# Patient Record
Sex: Female | Born: 1965 | Race: White | Hispanic: No
Health system: Southern US, Community
[De-identification: ages and names within clinical notes are randomized; demographics above are authoritative.]

## PROBLEM LIST (undated history)

## (undated) DIAGNOSIS — E119 Type 2 diabetes mellitus without complications: Secondary | ICD-10-CM

## (undated) DIAGNOSIS — I10 Essential (primary) hypertension: Secondary | ICD-10-CM

## (undated) DIAGNOSIS — C801 Malignant (primary) neoplasm, unspecified: Secondary | ICD-10-CM

## (undated) HISTORY — PX: BREAST SURGERY: SHX581

## (undated) HISTORY — DX: Malignant (primary) neoplasm, unspecified: C80.1

## (undated) HISTORY — PX: CHOLECYSTECTOMY: SHX55

---

## 2009-12-01 ENCOUNTER — Ambulatory Visit: Payer: Self-pay | Admitting: Genetic Counselor

## 2016-01-04 DIAGNOSIS — C50111 Malignant neoplasm of central portion of right female breast: Secondary | ICD-10-CM | POA: Insufficient documentation

## 2016-01-04 DIAGNOSIS — Z6836 Body mass index (BMI) 36.0-36.9, adult: Secondary | ICD-10-CM | POA: Insufficient documentation

## 2016-01-04 DIAGNOSIS — D499 Neoplasm of unspecified behavior of unspecified site: Secondary | ICD-10-CM | POA: Insufficient documentation

## 2016-02-12 DIAGNOSIS — E785 Hyperlipidemia, unspecified: Secondary | ICD-10-CM | POA: Diagnosis not present

## 2016-02-12 DIAGNOSIS — E1165 Type 2 diabetes mellitus with hyperglycemia: Secondary | ICD-10-CM | POA: Diagnosis not present

## 2016-02-12 DIAGNOSIS — C50919 Malignant neoplasm of unspecified site of unspecified female breast: Secondary | ICD-10-CM | POA: Diagnosis not present

## 2016-02-12 DIAGNOSIS — R002 Palpitations: Secondary | ICD-10-CM | POA: Diagnosis not present

## 2016-02-13 DIAGNOSIS — E1165 Type 2 diabetes mellitus with hyperglycemia: Secondary | ICD-10-CM | POA: Diagnosis not present

## 2016-02-13 DIAGNOSIS — R002 Palpitations: Secondary | ICD-10-CM | POA: Diagnosis not present

## 2016-02-13 DIAGNOSIS — C50919 Malignant neoplasm of unspecified site of unspecified female breast: Secondary | ICD-10-CM | POA: Diagnosis not present

## 2016-02-13 DIAGNOSIS — E785 Hyperlipidemia, unspecified: Secondary | ICD-10-CM | POA: Diagnosis not present

## 2016-02-15 DIAGNOSIS — R079 Chest pain, unspecified: Secondary | ICD-10-CM | POA: Insufficient documentation

## 2016-02-15 DIAGNOSIS — R002 Palpitations: Secondary | ICD-10-CM | POA: Insufficient documentation

## 2016-03-15 DIAGNOSIS — Z79810 Long term (current) use of selective estrogen receptor modulators (SERMs): Secondary | ICD-10-CM | POA: Diagnosis not present

## 2016-03-15 DIAGNOSIS — C50511 Malignant neoplasm of lower-outer quadrant of right female breast: Secondary | ICD-10-CM | POA: Diagnosis not present

## 2016-03-15 DIAGNOSIS — Z170 Estrogen receptor positive status [ER+]: Secondary | ICD-10-CM | POA: Diagnosis not present

## 2017-03-23 DIAGNOSIS — C50511 Malignant neoplasm of lower-outer quadrant of right female breast: Secondary | ICD-10-CM | POA: Diagnosis not present

## 2017-03-23 DIAGNOSIS — Z79810 Long term (current) use of selective estrogen receptor modulators (SERMs): Secondary | ICD-10-CM | POA: Diagnosis not present

## 2017-03-23 DIAGNOSIS — Z9011 Acquired absence of right breast and nipple: Secondary | ICD-10-CM | POA: Diagnosis not present

## 2017-03-23 DIAGNOSIS — Z170 Estrogen receptor positive status [ER+]: Secondary | ICD-10-CM | POA: Diagnosis not present

## 2017-11-06 ENCOUNTER — Ambulatory Visit: Payer: Self-pay | Admitting: Podiatry

## 2017-11-10 ENCOUNTER — Ambulatory Visit: Payer: BLUE CROSS/BLUE SHIELD | Admitting: Sports Medicine

## 2017-11-10 ENCOUNTER — Other Ambulatory Visit: Payer: Self-pay | Admitting: Sports Medicine

## 2017-11-10 ENCOUNTER — Ambulatory Visit (INDEPENDENT_AMBULATORY_CARE_PROVIDER_SITE_OTHER): Payer: BLUE CROSS/BLUE SHIELD

## 2017-11-10 ENCOUNTER — Encounter: Payer: Self-pay | Admitting: Sports Medicine

## 2017-11-10 DIAGNOSIS — M2042 Other hammer toe(s) (acquired), left foot: Secondary | ICD-10-CM

## 2017-11-10 DIAGNOSIS — M21619 Bunion of unspecified foot: Secondary | ICD-10-CM

## 2017-11-10 DIAGNOSIS — M2041 Other hammer toe(s) (acquired), right foot: Secondary | ICD-10-CM | POA: Diagnosis not present

## 2017-11-10 DIAGNOSIS — E1142 Type 2 diabetes mellitus with diabetic polyneuropathy: Secondary | ICD-10-CM

## 2017-11-10 DIAGNOSIS — M79674 Pain in right toe(s): Secondary | ICD-10-CM | POA: Diagnosis not present

## 2017-11-10 DIAGNOSIS — L03031 Cellulitis of right toe: Secondary | ICD-10-CM

## 2017-11-10 MED ORDER — CLINDAMYCIN HCL 300 MG PO CAPS
300.0000 mg | ORAL_CAPSULE | Freq: Three times a day (TID) | ORAL | 0 refills | Status: DC
Start: 2017-11-10 — End: 2018-10-17

## 2017-11-10 NOTE — Patient Instructions (Signed)

## 2017-11-10 NOTE — Progress Notes (Signed)
   Subjective:    Patient ID: Lauren Yu, female    DOB: July 28, 1965, 52 y.o.   MRN: [id number]  HPI    Review of Systems  Musculoskeletal: Positive for arthralgias, gait problem, joint swelling and myalgias.  All other systems reviewed and are negative.      Objective:   Physical Exam        Assessment & Plan:

## 2017-11-10 NOTE — Progress Notes (Signed)
Subjective: Lauren Yu is a 52 y.o. diabetic female patient presents to office today complaining of a moderately painful incurvated, red, hot, swollen medial nail border of the first toe on the right foot. This has been present for 3 days. Patient has treated this by trimming soaking with Epson salt and using peroxide. Patient reports that she continued to work for 12 hours even though her toe was inflamed and infected.  Patient denies fever/chills/nausea/vomitting/any other related constitutional symptoms at this time.  Patient also admits that she does have numbness over bilateral second toe that she has had for years and admits to significant bunion and hammertoe deformity on the left and history of toe fractures on right.  Patient is type II diabetic with blood sugar recorded this morning at 141 and last A1c 7.6.  Patient states that she last saw her primary care doctor Dr. Rock Nephew 1 to 2 years ago.  Review of Systems  Musculoskeletal: Positive for joint pain and myalgias.       Right toe pain and gait problem  All other systems reviewed and are negative.    There are no active problems to display for this patient.   No current outpatient medications on file prior to visit.   No current facility-administered medications on file prior to visit.     Not on File  Objective:  There were no vitals filed for this visit.  General: Well developed, nourished, in no acute distress, alert and oriented x3   Dermatology: Skin is warm, dry and supple bilateral.  Right hallux nail appears to be severely incurvated with hyperkeratosis formation at the distal aspects of the medial nail border. (+) Erythema. (+) Edema. (+) serosanguous drainage present. The remaining nails appear unremarkable at this time. There are no open sores, lesions or other signs of infection present.  Mild diffuse callusing plantar aspect of both feet.  Vascular: Dorsalis Pedis artery and Posterior Tibial artery pedal  pulses are 1/4 bilateral with immedate capillary fill time. Pedal hair growth present.  Trace edema present bilateral lower extremities with hyperpigmentation and varicosities consistent with venous stasis. Neruologic: Grossly intact via light touch bilateral.  Protective sensation slightly diminished especially over the second toes bilateral.  Musculoskeletal: Tenderness to palpation of the right hallux medial nail fold.  Long-standing bunion and hammertoe with worse deformity on left.  Muscular strength within normal limits in all groups bilateral.   X-rays left and right foot normal osseous mineralization, no fracture, there is dislocation at the second metatarsal phalangeal joint on the left consistent with severe hammertoe deformity and bunion with impingement on left, there is inferior and posterior calcaneal heel spur left greater than right and mild hammertoe deformity on right, soft tissues within normal limits, no other acute findings.  Assesement and Plan: Problem List Items Addressed This Visit    None    Visit Diagnoses    Paronychia of great toe, right    -  Primary   medial margin   Relevant Medications   clindamycin (CLEOCIN) 300 MG capsule   Toe pain, right       Hammer toes of both feet       Relevant Orders   DG Foot Complete Right   DG Foot Complete Left   Bunion       Diabetic polyneuropathy associated with type 2 diabetes mellitus ([place])         -Patient seen and evaluated -X-rays reviewed -Discussed with with patient condition of neuropathy with bunion and  hammertoe and advised at this time wide shoes cushions padding and monitoring if symptoms worsen we will do a further work-up for neuropathy and do a further evaluation for bunion and hammertoes -Discussed paronychia treatment alternatives and plan of care; Explained temporary nail avulsion and post procedure course to patient. - After a written and verbal consent, injected 3 ml of a 50:50 mixture of 2% plain  lidocaine and 0.5% plain marcaine in a normal hallux block fashion. Next, a betadine prep was performed. Anesthesia was tested and found to be appropriate.  The offending right hallux medial nail border was then incised from the hyponychium to the epinychium. The offending nail border was removed and cleared from the field. The area was curretted for any remaining nail or spicules and the area was then flushed with alcohol and dressed with antibiotic cream and a dry sterile dressing. -Patient was instructed to leave the dressing intact for today and begin soaking in a weak solution of betadine or Epsom salt and water tomorrow. Patient was instructed to soak for 15 minutes each day and apply neosporin and a gauze or bandaid dressing each day. -Patient was instructed to monitor the toe for signs of infection and return to office if toe becomes red, hot or swollen. -Prescribed clindamycin to take as instructed for paronychia for 10 days -Advised ice, elevation, and tylenol or motrin if needed for pain.  -Patient is to return in 2 weeks for follow up care/nail check or sooner if problems arise.  Landis Martins, DPM

## 2017-11-24 ENCOUNTER — Ambulatory Visit (INDEPENDENT_AMBULATORY_CARE_PROVIDER_SITE_OTHER): Payer: BLUE CROSS/BLUE SHIELD | Admitting: Sports Medicine

## 2017-11-24 ENCOUNTER — Encounter: Payer: Self-pay | Admitting: Sports Medicine

## 2017-11-24 ENCOUNTER — Telehealth: Payer: Self-pay | Admitting: *Deleted

## 2017-11-24 DIAGNOSIS — R252 Cramp and spasm: Secondary | ICD-10-CM | POA: Diagnosis not present

## 2017-11-24 DIAGNOSIS — I739 Peripheral vascular disease, unspecified: Secondary | ICD-10-CM

## 2017-11-24 DIAGNOSIS — E1142 Type 2 diabetes mellitus with diabetic polyneuropathy: Secondary | ICD-10-CM

## 2017-11-24 DIAGNOSIS — Z9889 Other specified postprocedural states: Secondary | ICD-10-CM

## 2017-11-24 NOTE — Progress Notes (Signed)
Subjective: Lauren Yu is a 52 y.o. female patient returns to office today for follow up evaluation after having Right Hallux medial permanent nail avulsion performed on 11-10-17. Patient has been soaking using epsom salt and applying topical antibiotic covered with bandaid daily. Patient deniesfever/chills/nausea/vomitting/any other related constitutional symptoms at this time.  Patient admits to increase swelling and cramping pain to both feet and legs which is concerning.  Patient reports that her brother had similar cramping symptoms and had vascular studies shown diminished circulation and she is concerned and want to have the same test done especially since she has had issues before in the past with her veins and discoloration.  Patient denies any rest pain or pain with extensive standing and walking however reports cramping in legs that is sporadic.  Patient has only tried gentle massage for these cramping no other treatment.  Patient reports that cramping has been going on for several years and denies any other acute issues at this time.  There are no active problems to display for this patient.   Current Outpatient Medications on File Prior to Visit  Medication Sig Dispense Refill  . clindamycin (CLEOCIN) 300 MG capsule Take 1 capsule (300 mg total) by mouth 3 (three) times daily. 30 capsule 0   No current facility-administered medications on file prior to visit.     Not on File  Objective:  General: Well developed, nourished, in no acute distress, alert and oriented x3   Dermatology: Skin is warm, dry and supple bilateral.  Right hallux medial nail bed appears to be clean, dry, with mild granular tissue and surrounding eschar/scab. (-) Erythema. (-) Edema. (-) serosanguous drainage present. The remaining nails appear unremarkable at this time. There are no other lesions or other signs of infection  present.  Neurovascular status: Intact.  Trace lower extremity swelling with venous  hyperpigmentation; No pain with calf compression bilateral.  Musculoskeletal: No tenderness palpation to right great toenail.  Subjective cramping occasionally bilateral.  Muscular strength within normal limits bilateral.   Assesement and Plan: Problem List Items Addressed This Visit    None    Visit Diagnoses    S/P nail surgery    -  Primary   Diabetic polyneuropathy associated with type 2 diabetes mellitus (HCC)       PVD (peripheral vascular disease) ([place])       Cramp in lower leg          -Examined patient  -Cleansed  right hallux medial nail fold and gently scrubbed with peroxide and q-tip/curetted away eschar at site and applied antibiotic cream covered with bandaid.  -Discussed plan of care with patient. -Patient no longer needs to soak and may leave open to air at night since area is well-healed -Patient was instructed to monitor the toe for reoccurrence and signs of infection; Patient advised to return to office or go to ER if toe becomes red, hot or swollen. Ordered vascular studies of-venous duplex for assessment of reflux and ABIs for assessment of arterial disease for a complete vascular study for surveillance -Patient is to return to office after vascular studies or sooner if problems arise.  Landis Martins, DPM

## 2017-11-24 NOTE — Telephone Encounter (Signed)
Faxed orders to [facility]. 

## 2017-11-24 NOTE — Telephone Encounter (Signed)
-----   Message from Landis Martins, [place] sent at 11/24/2017 10:55 AM EDT ----- Regarding: Venous reflux testing and ABIs Cramping and hyperpigmentation to legs Eval for worsening PVD -Dr. Chauncey Cruel

## 2017-11-27 NOTE — Telephone Encounter (Signed)
[facility] faxed notice to [phone], stating they are not currently performing vascular testing, recommended [facility] and [place].

## 2017-11-28 NOTE — Telephone Encounter (Signed)
Left message for pt to call for circulation appts.

## 2017-11-28 NOTE — Telephone Encounter (Addendum)
[facility] scheduled ABI with TBI 12/05/2017 arrive 11:00am for 11:30am, and venous insufficiency 12/06/2017 arrive 1:00pm for 1:30pm testing. Faxed orders to [place].

## 2017-12-06 ENCOUNTER — Encounter: Payer: Self-pay | Admitting: Sports Medicine

## 2018-01-01 DIAGNOSIS — Z853 Personal history of malignant neoplasm of breast: Secondary | ICD-10-CM | POA: Insufficient documentation

## 2018-01-01 DIAGNOSIS — Z1239 Encounter for other screening for malignant neoplasm of breast: Secondary | ICD-10-CM | POA: Insufficient documentation

## 2018-01-19 ENCOUNTER — Ambulatory Visit: Payer: BLUE CROSS/BLUE SHIELD | Admitting: Sports Medicine

## 2018-02-07 ENCOUNTER — Telehealth: Payer: Self-pay | Admitting: Sports Medicine

## 2018-02-07 NOTE — Telephone Encounter (Signed)
Pt called and left voicemail today @ 1134 stating she has an appt tomorrow in [place] for shoes and was wanting to know the cost and what the insurance is going to pay. Please call pt and advise.

## 2018-02-08 ENCOUNTER — Ambulatory Visit: Payer: BLUE CROSS/BLUE SHIELD | Admitting: *Deleted

## 2018-02-08 DIAGNOSIS — M21619 Bunion of unspecified foot: Secondary | ICD-10-CM

## 2018-02-08 DIAGNOSIS — M2042 Other hammer toe(s) (acquired), left foot: Secondary | ICD-10-CM

## 2018-02-08 DIAGNOSIS — M2041 Other hammer toe(s) (acquired), right foot: Secondary | ICD-10-CM

## 2018-02-08 DIAGNOSIS — E1142 Type 2 diabetes mellitus with diabetic polyneuropathy: Secondary | ICD-10-CM

## 2018-03-08 ENCOUNTER — Other Ambulatory Visit: Payer: BLUE CROSS/BLUE SHIELD

## 2018-03-16 ENCOUNTER — Ambulatory Visit (INDEPENDENT_AMBULATORY_CARE_PROVIDER_SITE_OTHER): Payer: BLUE CROSS/BLUE SHIELD | Admitting: Sports Medicine

## 2018-03-16 ENCOUNTER — Encounter: Payer: Self-pay | Admitting: Sports Medicine

## 2018-03-16 DIAGNOSIS — M2042 Other hammer toe(s) (acquired), left foot: Secondary | ICD-10-CM | POA: Diagnosis not present

## 2018-03-16 DIAGNOSIS — I739 Peripheral vascular disease, unspecified: Secondary | ICD-10-CM

## 2018-03-16 DIAGNOSIS — E1142 Type 2 diabetes mellitus with diabetic polyneuropathy: Secondary | ICD-10-CM | POA: Diagnosis not present

## 2018-03-16 DIAGNOSIS — M2041 Other hammer toe(s) (acquired), right foot: Secondary | ICD-10-CM

## 2018-03-16 DIAGNOSIS — M21619 Bunion of unspecified foot: Secondary | ICD-10-CM

## 2018-03-16 NOTE — Progress Notes (Signed)
Patient discussed with medical assistant. Agree with note. Patient to follow up as scheduled for continued care or sooner if problems or issues arise. -Dr. Calynn Ferrero 

## 2018-03-16 NOTE — Progress Notes (Signed)
Patient ID: Lauren Yu, female   DOB: [birth year], 52 y.o.   MRN: [id number]   Patient presents for diabetic shoe pick up, shoes are tried on for good fit.  Patient received 1 pair [employer] Women - Breeze Athletic Knit - FitLite Collection GREY in 10.5 extra wide and 3 pairs custom molded diabetic inserts.  Verbal and written break in and wear instructions given.

## 2018-03-16 NOTE — Patient Instructions (Signed)

## 2018-03-30 DIAGNOSIS — Z170 Estrogen receptor positive status [ER+]: Secondary | ICD-10-CM | POA: Diagnosis not present

## 2018-03-30 DIAGNOSIS — C50511 Malignant neoplasm of lower-outer quadrant of right female breast: Secondary | ICD-10-CM | POA: Diagnosis not present

## 2018-03-30 DIAGNOSIS — Z9011 Acquired absence of right breast and nipple: Secondary | ICD-10-CM

## 2018-03-30 DIAGNOSIS — Z79810 Long term (current) use of selective estrogen receptor modulators (SERMs): Secondary | ICD-10-CM | POA: Diagnosis not present

## 2018-04-19 NOTE — Progress Notes (Signed)
Patient ID: Lauren Yu, female   DOB: 1966/03/05, 52 y.o.   MRN: [id number]   Patient presents at Dr Leeanne Rio request to be measured for diabetic shoes and inserts with [facility] Certified Pedorthist.  Patient will be called when shoes and inserts arrive to schedule a fitting.

## 2018-06-19 DIAGNOSIS — M25562 Pain in left knee: Secondary | ICD-10-CM | POA: Insufficient documentation

## 2018-07-17 DIAGNOSIS — M1712 Unilateral primary osteoarthritis, left knee: Secondary | ICD-10-CM | POA: Insufficient documentation

## 2018-10-17 ENCOUNTER — Ambulatory Visit: Payer: BLUE CROSS/BLUE SHIELD | Admitting: Sports Medicine

## 2018-10-17 ENCOUNTER — Encounter: Payer: Self-pay | Admitting: Sports Medicine

## 2018-10-17 ENCOUNTER — Other Ambulatory Visit: Payer: Self-pay

## 2018-10-17 VITALS — Temp 96.7°F | Resp 16

## 2018-10-17 DIAGNOSIS — M79675 Pain in left toe(s): Secondary | ICD-10-CM

## 2018-10-17 DIAGNOSIS — L600 Ingrowing nail: Secondary | ICD-10-CM | POA: Diagnosis not present

## 2018-10-17 DIAGNOSIS — E1142 Type 2 diabetes mellitus with diabetic polyneuropathy: Secondary | ICD-10-CM

## 2018-10-17 MED ORDER — NEOMYCIN-POLYMYXIN-HC 3.5-10000-1 OT SOLN: OTIC | 0 refills | Status: DC

## 2018-10-17 NOTE — Progress Notes (Signed)
Subjective: Lauren Yu is a 53 y.o. female patient presents to office today complaining of a moderately painful incurvated left hallux medial nail border for the last couple of months states that there is 4-10 throbbing pain at the medial aspect of the first toe.  Patient admits that there is some hard callus skin and that grows along the margin as well as it feels like her nail is sticking in has tried no treatment because the last time she tried to trim her other toenail on her right foot she cause infection so she did not bother her at this time.  Patient denies redness warmth swelling or drainage reports that her last blood sugar on yesterday was 156 her last A1c 5.8 and saw her primary care doctor Dr. Rock Nephew 1 month ago.  No other pedal complaints noted.  There are no active problems to display for this patient.   Current Outpatient Medications on File Prior to Visit  Medication Sig Dispense Refill  . amLODipine (NORVASC) 2.5 MG tablet     . azithromycin (ZITHROMAX) 250 MG tablet Take by mouth.    . benzonatate (TESSALON) 100 MG capsule     . glipiZIDE (GLUCOTROL) 10 MG tablet     . guaiFENesin-codeine 100-10 MG/5ML syrup     . hydrochlorothiazide (HYDRODIURIL) 25 MG tablet     . lisinopril (ZESTRIL) 40 MG tablet lisinopril 40 mg tablet    . metroNIDAZOLE (FLAGYL) 500 MG tablet     . sitaGLIPtin-metformin (JANUMET) 50-1000 MG tablet Janumet 50 mg-1,000 mg tablet    . venlafaxine XR (EFFEXOR-XR) 150 MG 24 hr capsule venlafaxine ER 150 mg capsule,extended release 24 hr     No current facility-administered medications on file prior to visit.     Allergies  Allergen Reactions  . Penicillins Other (See Comments)    Unknown Unknown     Objective:  There were no vitals filed for this visit.  General: Well developed, nourished, in no acute distress, alert and oriented x3   Dermatology: Skin is warm, dry and supple bilateral.L hallux nail appears to be moderately incurvated with  hyperkeratosis formation at the distal aspects of the medial nail border. (-) Erythema. (+) Edema focal to the nail fold. (-) serosanguous  drainage present. The remaining nails appear unremarkable at this time. There are no open sores, lesions or other signs of infection  present.  Vascular: Dorsalis Pedis artery and Posterior Tibial artery pedal pulses are 1/4 bilateral with immedate capillary fill time. Pedal hair growth present that is sparse with venous hyperpigmentation noted bilateral.  Trace edema noted bilateral.  Neruologic: Grossly intact via light touch bilateral.  Musculoskeletal: Tenderness to palpation of the L medial hallux nail fold. Muscular strength within normal limits in all groups bilateral.  Bunion deformity noted left greater than right.  Assesement and Plan: Problem List Items Addressed This Visit    None    Visit Diagnoses    Ingrown nail    -  Primary   Toe pain, left       Diabetic polyneuropathy associated with type 2 diabetes mellitus (HCC)       Relevant Medications   glipiZIDE (GLUCOTROL) 10 MG tablet   lisinopril (ZESTRIL) 40 MG tablet   sitaGLIPtin-metformin (JANUMET) 50-1000 MG tablet   venlafaxine XR (EFFEXOR-XR) 150 MG 24 hr capsule     -Discussed treatment alternatives and plan of care; Explained permanent/temporary nail avulsion and post procedure course to patient. Patient elects for L hallux PNA.  -  After a verbal and written consent, injected 3 ml of a 50:50 mixture of 2% plain lidocaine and 0.5% plain marcaine in a normal hallux block fashion. Next, a betadine prep was performed. Anesthesia was tested and found to be appropriate.  The offending L hallux medial  nail border was then incised from the hyponychium to the epinychium. The offending nail border was removed and cleared from the field. The area was curretted for any remaining nail or spicules. Phenol application performed and the area was then flushed with alcohol and dressed with  antibiotic cream and a dry sterile dressing. -Patient was instructed to leave the dressing intact for today and begin soaking in a weak solution of betadine or Epsom salt and water tomorrow. Patient was instructed to soak for 15-20 minutes each day and apply corticosporin and a gauze or bandaid dressing each day. -Patient was instructed to monitor the toe for signs of infection and return to office if toe becomes red, hot or swollen. -Advised ice, elevation, and tylenol or motrin if needed for pain.  -Patient is to return in 2 weeks for follow up care/nail check or sooner if problems arise.  Landis Martins, DPM

## 2018-10-17 NOTE — Patient Instructions (Signed)

## 2018-10-22 DIAGNOSIS — K921 Melena: Secondary | ICD-10-CM | POA: Insufficient documentation

## 2018-10-22 DIAGNOSIS — K219 Gastro-esophageal reflux disease without esophagitis: Secondary | ICD-10-CM | POA: Insufficient documentation

## 2018-11-01 ENCOUNTER — Ambulatory Visit: Payer: BLUE CROSS/BLUE SHIELD | Admitting: Sports Medicine

## 2018-11-15 ENCOUNTER — Ambulatory Visit (INDEPENDENT_AMBULATORY_CARE_PROVIDER_SITE_OTHER): Payer: BC Managed Care – PPO | Admitting: Sports Medicine

## 2018-11-15 ENCOUNTER — Encounter: Payer: Self-pay | Admitting: Sports Medicine

## 2018-11-15 ENCOUNTER — Other Ambulatory Visit: Payer: Self-pay

## 2018-11-15 VITALS — Temp 98.0°F | Resp 16

## 2018-11-15 DIAGNOSIS — M722 Plantar fascial fibromatosis: Secondary | ICD-10-CM | POA: Diagnosis not present

## 2018-11-15 DIAGNOSIS — E1142 Type 2 diabetes mellitus with diabetic polyneuropathy: Secondary | ICD-10-CM

## 2018-11-15 DIAGNOSIS — M21619 Bunion of unspecified foot: Secondary | ICD-10-CM | POA: Diagnosis not present

## 2018-11-15 DIAGNOSIS — Z9889 Other specified postprocedural states: Secondary | ICD-10-CM

## 2018-11-15 DIAGNOSIS — M79675 Pain in left toe(s): Secondary | ICD-10-CM

## 2018-11-15 NOTE — Progress Notes (Signed)
Subjective: Tahliyah Anagnos is a 53 y.o. female patient returns to office today for follow up evaluation after having Left Hallux medial permanent nail avulsion performed on (10/17/2018). Patient has been soaking using epsom salt and applying topical antibiotic drops covered with bandaid daily. Patient deniesfever/chills/nausea/vomitting/any other related constitutional symptoms at this time.  Patient admits that sometimes she gets a burning pain over the left bunion especially when she is in her work shoes.  Patient denies any other issues and reports that her blood sugars are under good control.  There are no active problems to display for this patient.   Current Outpatient Medications on File Prior to Visit  Medication Sig Dispense Refill  . amLODipine (NORVASC) 2.5 MG tablet     . azithromycin (ZITHROMAX) 250 MG tablet Take by mouth.    . benzonatate (TESSALON) 100 MG capsule     . glipiZIDE (GLUCOTROL) 10 MG tablet     . guaiFENesin-codeine 100-10 MG/5ML syrup     . hydrochlorothiazide (HYDRODIURIL) 25 MG tablet     . lisinopril (ZESTRIL) 40 MG tablet lisinopril 40 mg tablet    . metroNIDAZOLE (FLAGYL) 500 MG tablet     . neomycin-polymyxin-hydrocortisone (CORTISPORIN) OTIC solution Apply 2-3 drops to the ingrown toenail site twice daily. Cover with band-aid. 10 mL 0  . sitaGLIPtin-metformin (JANUMET) 50-1000 MG tablet Janumet 50 mg-1,000 mg tablet    . venlafaxine XR (EFFEXOR-XR) 150 MG 24 hr capsule venlafaxine ER 150 mg capsule,extended release 24 hr     No current facility-administered medications on file prior to visit.     Allergies  Allergen Reactions  . Penicillins Other (See Comments)    Unknown Unknown     Objective:  General: Well developed, nourished, in no acute distress, alert and oriented x3   Dermatology: Skin is warm, dry and supple bilateral.  Left hallux medial nail bed appears to be clean, dry, with scab. (-) Erythema. (-) Edema. (-) serosanguous drainage  present. The remaining nails appear unremarkable at this time. There are no other lesions or other signs of infection present.  There is a raised soft tissue nodule in the left arch likely consistent with fibroma that is nonpainful or nonpulsatile measures less than 1 cm.  Neurovascular status: Intact. No lower extremity swelling; No pain with calf compression bilateral.  Musculoskeletal: Decreased tenderness to palpation of the left hallux medial nail fold.  Significant bunion and hammertoe bilateral left greater than right.  Pes planus foot type.. Muscular strength within normal limits bilateral.   Assesement and Plan: Problem List Items Addressed This Visit    None    Visit Diagnoses    Status post nail surgery    -  Primary   Toe pain, left       Diabetic polyneuropathy associated with type 2 diabetes mellitus (HCC)       Bunion       Plantar fibromatosis         -Examined patient  -Left hallux medial nail fold is well-healed patient no longer needs to soak or dress the area -Educated patient on long term care after nail surgery. -Patient was instructed to monitor the toe for reoccurrence and signs of infection; Patient advised to return to office or go to ER if toe becomes red, hot or swollen. -Dispensed to patient bunion shield to wear especially when she is in close toed shoes on the left to prevent rubbing or irritation to the bunion -Dispensed offloading padding for patient to use at  left arch and advised topical pain creams or rubs or silicone patches that can aid with helping to decrease the size of the plantar fibroma advised patient that we will closely monitor and if increases in size we will be more aggressive today the area measure less than 1 cm. -Patient is to return as needed or sooner if problems arise.  Advised patient that she should come at least once a year for routine diabetic yearly foot checkups.  Landis Martins, DPM

## 2019-01-21 ENCOUNTER — Inpatient Hospital Stay (HOSPITAL_COMMUNITY): Payer: BC Managed Care – PPO

## 2019-01-21 ENCOUNTER — Other Ambulatory Visit: Payer: Self-pay

## 2019-01-21 ENCOUNTER — Inpatient Hospital Stay (HOSPITAL_COMMUNITY)
Admission: AD | Admit: 2019-01-21 | Discharge: 2019-01-26 | DRG: 177 | Disposition: A | Discharge status: Home / Self Care | Payer: BC Managed Care – PPO | Source: Other Acute Inpatient Hospital | Attending: Internal Medicine | Admitting: Internal Medicine

## 2019-01-21 ENCOUNTER — Encounter (HOSPITAL_COMMUNITY): Payer: Self-pay | Admitting: Internal Medicine

## 2019-01-21 DIAGNOSIS — Z833 Family history of diabetes mellitus: Secondary | ICD-10-CM | POA: Diagnosis not present

## 2019-01-21 DIAGNOSIS — E1165 Type 2 diabetes mellitus with hyperglycemia: Secondary | ICD-10-CM | POA: Diagnosis present

## 2019-01-21 DIAGNOSIS — U071 COVID-19: Secondary | ICD-10-CM | POA: Diagnosis present

## 2019-01-21 DIAGNOSIS — I10 Essential (primary) hypertension: Secondary | ICD-10-CM | POA: Diagnosis present

## 2019-01-21 DIAGNOSIS — E785 Hyperlipidemia, unspecified: Secondary | ICD-10-CM | POA: Diagnosis present

## 2019-01-21 DIAGNOSIS — J1282 Pneumonia due to coronavirus disease 2019: Secondary | ICD-10-CM | POA: Diagnosis present

## 2019-01-21 DIAGNOSIS — Z79899 Other long term (current) drug therapy: Secondary | ICD-10-CM | POA: Diagnosis not present

## 2019-01-21 DIAGNOSIS — Z923 Personal history of irradiation: Secondary | ICD-10-CM | POA: Diagnosis not present

## 2019-01-21 DIAGNOSIS — Z7984 Long term (current) use of oral hypoglycemic drugs: Secondary | ICD-10-CM

## 2019-01-21 DIAGNOSIS — Z853 Personal history of malignant neoplasm of breast: Secondary | ICD-10-CM | POA: Diagnosis not present

## 2019-01-21 DIAGNOSIS — R0602 Shortness of breath: Secondary | ICD-10-CM | POA: Diagnosis present

## 2019-01-21 DIAGNOSIS — Z9221 Personal history of antineoplastic chemotherapy: Secondary | ICD-10-CM

## 2019-01-21 DIAGNOSIS — Z79810 Long term (current) use of selective estrogen receptor modulators (SERMs): Secondary | ICD-10-CM

## 2019-01-21 DIAGNOSIS — E876 Hypokalemia: Secondary | ICD-10-CM | POA: Diagnosis present

## 2019-01-21 DIAGNOSIS — R7982 Elevated C-reactive protein (CRP): Secondary | ICD-10-CM | POA: Diagnosis present

## 2019-01-21 DIAGNOSIS — J9601 Acute respiratory failure with hypoxia: Secondary | ICD-10-CM | POA: Diagnosis present

## 2019-01-21 DIAGNOSIS — J1289 Other viral pneumonia: Secondary | ICD-10-CM | POA: Diagnosis present

## 2019-01-21 HISTORY — DX: Type 2 diabetes mellitus without complications: E11.9

## 2019-01-21 HISTORY — DX: Essential (primary) hypertension: I10

## 2019-01-21 LAB — COMPREHENSIVE METABOLIC PANEL
ALT: 19 U/L (ref 0–44)
AST: 20 U/L (ref 15–41)
Albumin: 2.6 g/dL — ABNORMAL LOW (ref 3.5–5.0)
Alkaline Phosphatase: 36 U/L — ABNORMAL LOW (ref 38–126)
Anion gap: 10 (ref 5–15)
BUN: 14 mg/dL (ref 6–20)
CO2: 30 mmol/L (ref 22–32)
Calcium: 8.2 mg/dL — ABNORMAL LOW (ref 8.9–10.3)
Chloride: 98 mmol/L (ref 98–111)
Creatinine, Ser: 0.86 mg/dL (ref 0.44–1.00)
GFR calc Af Amer: >60 mL/min (ref >60)
GFR calc non Af Amer: >60 mL/min (ref >60)
Glucose, Bld: 176 mg/dL — ABNORMAL HIGH (ref 70–99)
Potassium: 3.3 mmol/L — ABNORMAL LOW (ref 3.5–5.1)
Sodium: 138 mmol/L (ref 135–145)
Total Bilirubin: 0.3 mg/dL (ref 0.3–1.2)
Total Protein: 6 g/dL — ABNORMAL LOW (ref 6.5–8.1)

## 2019-01-21 LAB — CBC WITH DIFFERENTIAL/PLATELET
Abs Immature Granulocytes: 0.07 10*3/uL (ref 0.00–0.07)
Basophils Absolute: 0 10*3/uL (ref 0.0–0.1)
Basophils Relative: 0 %
Eosinophils Absolute: 0 10*3/uL (ref 0.0–0.5)
Eosinophils Relative: 0 %
HCT: 33.3 % — ABNORMAL LOW (ref 36.0–46.0)
Hemoglobin: 11.1 g/dL — ABNORMAL LOW (ref 12.0–15.0)
Immature Granulocytes: 1 %
Lymphocytes Relative: 30 %
Lymphs Abs: 1.8 10*3/uL (ref 0.7–4.0)
MCH: 30.2 pg (ref 26.0–34.0)
MCHC: 33.3 g/dL (ref 30.0–36.0)
MCV: 90.7 fL (ref 80.0–100.0)
Monocytes Absolute: 0.3 10*3/uL (ref 0.1–1.0)
Monocytes Relative: 5 %
Neutro Abs: 3.8 10*3/uL (ref 1.7–7.7)
Neutrophils Relative %: 64 %
Platelets: 108 10*3/uL — ABNORMAL LOW (ref 150–400)
RBC: 3.67 MIL/uL — ABNORMAL LOW (ref 3.87–5.11)
RDW: 13 % (ref 11.5–15.5)
WBC: 5.9 10*3/uL (ref 4.0–10.5)
nRBC: 0 % (ref 0.0–0.2)

## 2019-01-21 LAB — HIV ANTIBODY (ROUTINE TESTING W REFLEX): HIV Screen 4th Generation wRfx: NONREACTIVE

## 2019-01-21 LAB — MRSA PCR SCREENING: MRSA by PCR: NEGATIVE

## 2019-01-21 LAB — FERRITIN: Ferritin: 539 ng/mL — ABNORMAL HIGH (ref 11–307)

## 2019-01-21 LAB — GLUCOSE, CAPILLARY
Glucose-Capillary: 137 mg/dL — ABNORMAL HIGH (ref 70–99)
Glucose-Capillary: 276 mg/dL — ABNORMAL HIGH (ref 70–99)

## 2019-01-21 LAB — TYPE AND SCREEN
ABO/RH(D): B POS
Antibody Screen: NEGATIVE

## 2019-01-21 LAB — SARS CORONAVIRUS 2 BY RT PCR (HOSPITAL ORDER, PERFORMED IN ~~LOC~~ HOSPITAL LAB): SARS Coronavirus 2: POSITIVE — AB

## 2019-01-21 LAB — D-DIMER, QUANTITATIVE: D-Dimer, Quant: 0.58 ug/mL-FEU — ABNORMAL HIGH (ref 0.00–0.50)

## 2019-01-21 LAB — C-REACTIVE PROTEIN: CRP: 10.3 mg/dL — ABNORMAL HIGH (ref <1.0)

## 2019-01-21 LAB — HEMOGLOBIN A1C
Hgb A1c MFr Bld: 8.4 % — ABNORMAL HIGH (ref 4.8–5.6)
Mean Plasma Glucose: 194.38 mg/dL

## 2019-01-21 LAB — ABO/RH: ABO/RH(D): B POS

## 2019-01-21 LAB — PROCALCITONIN: Procalcitonin: <0.1 ng/mL

## 2019-01-21 MED ORDER — INSULIN ASPART 100 UNIT/ML ~~LOC~~ SOLN
0.0000 [IU] | Freq: Three times a day (TID) | SUBCUTANEOUS | Status: DC
  Administered 2019-01-21: 2 [IU] via SUBCUTANEOUS
  Administered 2019-01-21: 17:00:00 11 [IU] via SUBCUTANEOUS
  Administered 2019-01-21: 12:00:00 8 [IU] via SUBCUTANEOUS
  Administered 2019-01-22: 5 [IU] via SUBCUTANEOUS

## 2019-01-21 MED ORDER — VITAMIN C 500 MG PO TABS
500.0000 mg | ORAL_TABLET | Freq: Every day | ORAL | Status: DC
  Administered 2019-01-21 – 2019-01-26 (×6): 500 mg via ORAL
  Filled 2019-01-21 (×6): qty 1

## 2019-01-21 MED ORDER — VENLAFAXINE HCL ER 150 MG PO CP24
150.0000 mg | ORAL_CAPSULE | Freq: Every day | ORAL | Status: DC
  Administered 2019-01-21 – 2019-01-26 (×6): 150 mg via ORAL
  Filled 2019-01-21 (×8): qty 1

## 2019-01-21 MED ORDER — CHLORHEXIDINE GLUCONATE CLOTH 2 % EX PADS
6.0000 | MEDICATED_PAD | Freq: Every day | CUTANEOUS | Status: DC
  Administered 2019-01-21 – 2019-01-22 (×2): 6 via TOPICAL

## 2019-01-21 MED ORDER — SODIUM CHLORIDE 0.9 % IV SOLN
200.0000 mg | Freq: Once | INTRAVENOUS | Status: DC
  Administered 2019-01-21: 17:00:00 200 mg via INTRAVENOUS
  Filled 2019-01-21: qty 40

## 2019-01-21 MED ORDER — ACETAMINOPHEN 325 MG PO TABS
650.0000 mg | ORAL_TABLET | Freq: Four times a day (QID) | ORAL | Status: DC | PRN
  Administered 2019-01-21 – 2019-01-22 (×2): 650 mg via ORAL
  Filled 2019-01-21 (×2): qty 2

## 2019-01-21 MED ORDER — SODIUM CHLORIDE 0.9 % IV SOLN
100.0000 mg | INTRAVENOUS | Status: AC
  Administered 2019-01-22 – 2019-01-25 (×4): 100 mg via INTRAVENOUS
  Filled 2019-01-21 (×4): qty 20

## 2019-01-21 MED ORDER — ONDANSETRON HCL 4 MG/2ML IJ SOLN
4.0000 mg | Freq: Four times a day (QID) | INTRAMUSCULAR | Status: DC | PRN
  Administered 2019-01-21: 4 mg via INTRAVENOUS
  Filled 2019-01-21: qty 2

## 2019-01-21 MED ORDER — METHYLPREDNISOLONE SODIUM SUCC 40 MG IJ SOLR
40.0000 mg | Freq: Two times a day (BID) | INTRAMUSCULAR | Status: DC
  Administered 2019-01-21 – 2019-01-25 (×9): 40 mg via INTRAVENOUS
  Filled 2019-01-21 (×9): qty 1

## 2019-01-21 MED ORDER — INSULIN ASPART 100 UNIT/ML ~~LOC~~ SOLN: 0.0000 [IU] | Freq: Three times a day (TID) | SUBCUTANEOUS | Status: DC

## 2019-01-21 MED ORDER — ENOXAPARIN SODIUM 40 MG/0.4ML ~~LOC~~ SOLN
40.0000 mg | SUBCUTANEOUS | Status: DC
  Administered 2019-01-21 – 2019-01-26 (×6): 40 mg via SUBCUTANEOUS
  Filled 2019-01-21 (×6): qty 0.4

## 2019-01-21 MED ORDER — ALBUTEROL SULFATE HFA 108 (90 BASE) MCG/ACT IN AERS
2.0000 | INHALATION_SPRAY | Freq: Four times a day (QID) | RESPIRATORY_TRACT | Status: DC | PRN
  Filled 2019-01-21: qty 6.7

## 2019-01-21 MED ORDER — HYDROCHLOROTHIAZIDE 25 MG PO TABS
25.0000 mg | ORAL_TABLET | Freq: Every day | ORAL | Status: DC
  Administered 2019-01-21 – 2019-01-23 (×3): 25 mg via ORAL
  Filled 2019-01-21 (×4): qty 1

## 2019-01-21 MED ORDER — ONDANSETRON HCL 4 MG PO TABS: 4.0000 mg | ORAL_TABLET | Freq: Four times a day (QID) | ORAL | Status: DC | PRN

## 2019-01-21 MED ORDER — ZINC SULFATE 220 (50 ZN) MG PO CAPS
220.0000 mg | ORAL_CAPSULE | Freq: Every day | ORAL | Status: DC
  Administered 2019-01-21 – 2019-01-26 (×6): 220 mg via ORAL
  Filled 2019-01-21 (×6): qty 1

## 2019-01-21 MED ORDER — LISINOPRIL 20 MG PO TABS
40.0000 mg | ORAL_TABLET | Freq: Every day | ORAL | Status: DC
  Administered 2019-01-21 – 2019-01-26 (×6): 40 mg via ORAL
  Filled 2019-01-21 (×6): qty 2
  Filled 2019-01-21: qty 4

## 2019-01-21 MED ORDER — ORAL CARE MOUTH RINSE
15.0000 mL | Freq: Two times a day (BID) | OROMUCOSAL | Status: DC
  Administered 2019-01-21 – 2019-01-25 (×10): 15 mL via OROMUCOSAL

## 2019-01-21 MED ORDER — AMLODIPINE BESYLATE 5 MG PO TABS
2.5000 mg | ORAL_TABLET | Freq: Every day | ORAL | Status: DC
  Administered 2019-01-21 – 2019-01-26 (×6): 2.5 mg via ORAL
  Filled 2019-01-21 (×6): qty 1

## 2019-01-21 MED ORDER — ACETAMINOPHEN 325 MG PO TABS
650.0000 mg | ORAL_TABLET | Freq: Once | ORAL | Status: AC
  Administered 2019-01-21: 03:00:00 650 mg via ORAL
  Filled 2019-01-21: qty 2

## 2019-01-21 MED ORDER — SODIUM CHLORIDE 0.9 % IV SOLN
200.0000 mg | Freq: Once | INTRAVENOUS | Status: DC
  Filled 2019-01-21: qty 40

## 2019-01-21 MED ORDER — ACETAMINOPHEN 650 MG RE SUPP: 650.0000 mg | Freq: Four times a day (QID) | RECTAL | Status: DC | PRN

## 2019-01-21 MED ORDER — HYDRALAZINE HCL 20 MG/ML IJ SOLN
5.0000 mg | Freq: Four times a day (QID) | INTRAMUSCULAR | Status: DC | PRN
  Administered 2019-01-21: 5 mg via INTRAVENOUS
  Filled 2019-01-21 (×2): qty 1

## 2019-01-21 MED ORDER — VITAMIN C 500 MG/5ML PO SYRP: 500.0000 mg | ORAL_SOLUTION | Freq: Every day | ORAL | Status: DC

## 2019-01-21 NOTE — Progress Notes (Addendum)
Please see H&P from this morning.  53 year old female with hypertension, hyperlipidemia, diabetes admitted with COVID pneumonia with acute hypoxic respiratory failure requiring 2 L of nasal cannula now.  Still complaining of some headache.  No nausea.  She is supposed to be receiving Ramdesivir (contacted pharmacy to f/u) on admission and currently on Solu-Medrol 40 mg every 12.  Added MDI for as needed use and vitamin C/zinc.  She is aware that she might be transferred to [place] if and when bed available, and is agreeable.

## 2019-01-21 NOTE — Progress Notes (Signed)
Inpatient Diabetes Program Recommendations  AACE/ADA: New Consensus Statement on Inpatient Glycemic Control (2015)  Target Ranges:  Prepandial:   less than 140 mg/dL      Peak postprandial:   less than 180 mg/dL (1-2 hours)      Critically ill patients:  140 - 180 mg/dL   Lab Results  Component Value Date   GLUCAP 276 (H) 01/21/2019   HGBA1C 8.4 (H) 01/21/2019    Review of Glycemic Control  Diabetes history: DM2 Outpatient Diabetes medications: glipizide 10 mg QAM, Janumet 50/1000 mg bid Current orders for Inpatient glycemic control: Novolog 0-15 units tidwc  On Solumedrol 40 mg Q12H HgbA1C - 8.4% - sub-optimal glycemic control  Inpatient Diabetes Program Recommendations:     Add Levemir 7 units bid Add Novolog HS correction Add Novolog 4 units tidwc for meal coverage insulin if pt eats > 50% meal  Will continue to follow closely.  Thank you. Lorenda Peck, RD, LDN, CDE Inpatient Diabetes Coordinator [phone]

## 2019-01-21 NOTE — H&P (Signed)
History and Physical    [name] DPO:[id number] DOB: 22-Mar-1966 DOA: 01/21/2019  PCP: Suzan Garibaldi, FNP  Patient coming from: Patient was transferred from [facility].  Chief Complaint: Shortness of breath.  HPI: Lauren Yu is a 53 y.o. female with history of diabetes mellitus type 2, hypertension, hyperlipidemia has been experiencing productive cough shortness of breath headache generalized body ache over the last 1 week.  Had gone to her primary care physician about a week ago and at that time was empirically placed on antibiotics and had COVID-19 test done which came back as positive two days later.  Over the last 24 hours patient symptoms have been worsening with increasing shortness of breath even at rest patient decided to come to the ER at [facility] patient was found to be hypoxic requiring 4 L oxygen with chest x-ray showing bilateral infiltrates and COVID test 19 was positive.  CRP levels were 63 ferritin at 500 platelets and 115.  Patient was transferred to [facility] for further management.  I have reviewed patient's chart from [facility].  LFTs and creatinine were normal.  Hemoglobin was around 12.  EKG was showing normal sinus rhythm.  Patient had frontal headache on arrival and I ordered Tylenol following which headache is resolved.  Appears nonfocal.  Not in distress.  Since arrival patient's oxygen requirement has decreased from 4 L to 2 L.  ED Course: Patient was a direct admit.  Review of Systems: As per HPI, rest all negative.   Past Medical History:  Diagnosis Date  . Diabetes mellitus without complication (Lauren Yu)   . Hypertension     Past Surgical History:  Procedure Laterality Date  . BREAST SURGERY    . CHOLECYSTECTOMY       reports that she has never smoked. She has never used smokeless tobacco. She reports that she does not drink alcohol. No history on file for drug.  Allergies  Allergen  Reactions  . Penicillins Other (See Comments)    Unknown Unknown     Family History  Problem Relation Age of Onset  . Breast cancer Mother   . Diabetes Mellitus II Mother     Prior to Admission medications   Medication Sig Start Date End Date Taking? Authorizing Provider  amLODipine (NORVASC) 2.5 MG tablet  10/09/18   [provider]  azithromycin (ZITHROMAX) 250 MG tablet Take by mouth.    [provider]  benzonatate (TESSALON) 100 MG capsule  08/02/18   [provider]  glipiZIDE (GLUCOTROL) 10 MG tablet  10/09/18   [provider]  guaiFENesin-codeine 100-10 MG/5ML syrup  08/08/18   [provider]  hydrochlorothiazide (HYDRODIURIL) 25 MG tablet  10/09/18   [provider]  lisinopril (ZESTRIL) 40 MG tablet lisinopril 40 mg tablet    [provider]  metroNIDAZOLE (FLAGYL) 500 MG tablet  08/13/18   [provider]  neomycin-polymyxin-hydrocortisone (CORTISPORIN) OTIC solution Apply 2-3 drops to the ingrown toenail site twice daily. Cover with band-aid. 10/17/18   Landis Martins, DPM  sitaGLIPtin-metformin (JANUMET) 50-1000 MG tablet Janumet 50 mg-1,000 mg tablet 12/19/17   [provider]  venlafaxine XR (EFFEXOR-XR) 150 MG 24 hr capsule venlafaxine ER 150 mg capsule,extended release 24 hr    [provider]    Physical Exam: Constitutional: Moderately built and nourished.  Blood pressure is 160/80 pulse is 80/min temperature 99 F aspiration 18/min on 2 L. There were no vitals filed for  this visit. Eyes: Anicteric no pallor. ENMT: No discharge from the ears eyes nose or mouth. Neck: No mass felt.  No neck rigidity. Respiratory: No rhonchi or crepitations. Cardiovascular: S1-S2 heard. Abdomen: Soft nontender bowel sounds present. Musculoskeletal: No edema.  No joint effusion. Skin: No rash. Neurologic: Alert awake oriented to time place and person.  Moves all extremities. Psychiatric: Appears  normal per normal affect.   Labs on Admission: I have personally reviewed following labs and imaging studies  CBC: No results for input(s): WBC, NEUTROABS, HGB, HCT, MCV, PLT in the last 168 hours. Basic Metabolic Panel: No results for input(s): NA, K, CL, CO2, GLUCOSE, BUN, CREATININE, CALCIUM, MG, PHOS in the last 168 hours. GFR: CrCl cannot be calculated (No successful lab value found.). Liver Function Tests: No results for input(s): AST, ALT, ALKPHOS, BILITOT, PROT, ALBUMIN in the last 168 hours. No results for input(s): LIPASE, AMYLASE in the last 168 hours. No results for input(s): AMMONIA in the last 168 hours. Coagulation Profile: No results for input(s): INR, PROTIME in the last 168 hours. Cardiac Enzymes: No results for input(s): CKTOTAL, CKMB, CKMBINDEX, TROPONINI in the last 168 hours. BNP (last 3 results) No results for input(s): PROBNP in the last 8760 hours. HbA1C: No results for input(s): HGBA1C in the last 72 hours. CBG: No results for input(s): GLUCAP in the last 168 hours. Lipid Profile: No results for input(s): CHOL, HDL, LDLCALC, TRIG, CHOLHDL, LDLDIRECT in the last 72 hours. Thyroid Function Tests: No results for input(s): TSH, T4TOTAL, FREET4, T3FREE, THYROIDAB in the last 72 hours. Anemia Panel: No results for input(s): VITAMINB12, FOLATE, FERRITIN, TIBC, IRON, RETICCTPCT in the last 72 hours. Urine analysis: No results found for: COLORURINE, APPEARANCEUR, LABSPEC, PHURINE, GLUCOSEU, HGBUR, BILIRUBINUR, KETONESUR, PROTEINUR, UROBILINOGEN, NITRITE, LEUKOCYTESUR Sepsis Labs: @LABRCNTIP (procalcitonin:4,lacticidven:4) )No results found for this or any previous visit (from the past 240 hour(s)).   Radiological Exams on Admission: No results found.  EKG: Independently reviewed.  Normal sinus rhythm with EKG done at another hospital.  Assessment/Plan Principal Problem:   Acute respiratory failure with hypoxia (HCC) Active Problems:   Pneumonia due to  COVID-19 virus   Controlled type 2 diabetes mellitus with hyperglycemia ([place])   Essential hypertension    1. Acute respiratory failure with hypoxia secondary to COVID pneumonia -given the elevated CRP and ferritin levels with chest x-ray showing bilateral infiltrates normal LFTs we will keep patient on Solu-Medrol and will consult pharmacy for [place].  After admission patient's oxygen requirements have decreased and is presently on 2 L.  Repeat labs including metabolic panel CRP CBC has been ordered chest x-ray is pending. 2. Diabetes mellitus type 2 we will keep patient on moderate dose sliding scale coverage since patient is on Solu-Medrol.  May need further titrating given the Solu-Medrol. 3. Hypertension on lisinopril hydrochlorothiazide.  Patient's potassium was in the lower side around 3.4.  If repeat metabolic panel still shows hypokalemia to hold hydrochlorothiazide. 4. Mild hypokalemia repeat metabolic panel has been ordered see #3. 5. Hyperlipidemia on statins. 6. History of depression on Effexor.  Given the acute hypoxic respiratory failure with COVID-19 and multiple comorbidities patient will require inpatient status.   DVT prophylaxis: Lovenox. Code Status: Full code. Family Communication: Discussed with patient. Disposition Plan: Home. Consults called: None. Admission status: Inpatient.   Rise Patience MD [facility] Pager [phone].  If 7PM-7AM, please contact night-coverage [email or web address] Password [id number]  01/21/2019, 2:47 AM

## 2019-01-21 NOTE — Progress Notes (Addendum)
Brief Pharmacy Note: Lovenox for VTE prophylaxis, Remdesivir  TBW 95 kg, using Lovenox 40mg  SQ daily Ddimer 0.58, not elevated, no need to use increased dose Lovenox for Covid patient  Remdesivir 200mg  today 8/17, followed by 100mg  daily x 4 doses if continues to meet criteria - requiring O2 - signs PNA on Xray  Daily CMET for Remdesivir monitoring  Minda Ditto PharmD Pager [phone] 01/21/2019, 9:31 AM

## 2019-01-22 LAB — GLUCOSE, CAPILLARY
Glucose-Capillary: 332 mg/dL — ABNORMAL HIGH (ref 70–99)
Glucose-Capillary: 225 mg/dL — ABNORMAL HIGH (ref 70–99)
Glucose-Capillary: 378 mg/dL — ABNORMAL HIGH (ref 70–99)
Glucose-Capillary: 359 mg/dL — ABNORMAL HIGH (ref 70–99)
Glucose-Capillary: 411 mg/dL — ABNORMAL HIGH (ref 70–99)

## 2019-01-22 LAB — MAGNESIUM: Magnesium: 1.7 mg/dL (ref 1.7–2.4)

## 2019-01-22 LAB — TYPE AND SCREEN
ABO/RH(D): B POS
Antibody Screen: NEGATIVE

## 2019-01-22 LAB — ABO/RH: ABO/RH(D): B POS

## 2019-01-22 MED ORDER — INSULIN ASPART 100 UNIT/ML ~~LOC~~ SOLN
0.0000 [IU] | Freq: Every day | SUBCUTANEOUS | Status: DC
  Administered 2019-01-22 – 2019-01-24 (×3): 5 [IU] via SUBCUTANEOUS
  Administered 2019-01-25: 3 [IU] via SUBCUTANEOUS

## 2019-01-22 MED ORDER — INSULIN ASPART 100 UNIT/ML ~~LOC~~ SOLN
6.0000 [IU] | Freq: Three times a day (TID) | SUBCUTANEOUS | Status: DC
  Administered 2019-01-22 – 2019-01-23 (×5): 6 [IU] via SUBCUTANEOUS

## 2019-01-22 MED ORDER — POTASSIUM CHLORIDE CRYS ER 20 MEQ PO TBCR
40.0000 meq | EXTENDED_RELEASE_TABLET | Freq: Two times a day (BID) | ORAL | Status: AC
  Administered 2019-01-22 (×2): 40 meq via ORAL
  Filled 2019-01-22 (×2): qty 2

## 2019-01-22 MED ORDER — INSULIN DETEMIR 100 UNIT/ML ~~LOC~~ SOLN
20.0000 [IU] | Freq: Every day | SUBCUTANEOUS | Status: DC
  Administered 2019-01-22 – 2019-01-25 (×4): 20 [IU] via SUBCUTANEOUS
  Filled 2019-01-22 (×5): qty 0.2

## 2019-01-22 MED ORDER — INSULIN ASPART 100 UNIT/ML ~~LOC~~ SOLN
0.0000 [IU] | Freq: Three times a day (TID) | SUBCUTANEOUS | Status: DC
  Administered 2019-01-22 (×2): 20 [IU] via SUBCUTANEOUS
  Administered 2019-01-23: 3 [IU] via SUBCUTANEOUS
  Administered 2019-01-23: 18:00:00 20 [IU] via SUBCUTANEOUS
  Administered 2019-01-23: 14:00:00 15 [IU] via SUBCUTANEOUS
  Administered 2019-01-24: 4 [IU] via SUBCUTANEOUS
  Administered 2019-01-24 – 2019-01-25 (×2): 15 [IU] via SUBCUTANEOUS
  Administered 2019-01-25: 3 [IU] via SUBCUTANEOUS
  Administered 2019-01-25: 11 [IU] via SUBCUTANEOUS
  Administered 2019-01-26: 13:00:00 7 [IU] via SUBCUTANEOUS

## 2019-01-22 NOTE — Progress Notes (Signed)
[facility] PROGRESS NOTE    Progress Note  Lauren Yu  QPY:[id number] DOB: 1966/01/22 DOA: 01/21/2019 PCP: Suzan Garibaldi, FNP     Brief Narrative:   Lauren Yu is an 53 y.o. female past medical history of diabetes mellitus, hypertension, history of right breast cancer T3 N1 M0 stage IIIa HER-2 negative she underwent mastectomy with lymph node biopsy on  and adjuvant radiation  And neoadjuvant chemotherapy therapy currently on tamoxifen has been experiencing productive cough and body aches for a week went to her primary care doctor placed empirically on antibiotic SARS-CoV-2 PCR was determined who came back positive on 01/20/2019.  She relates that over the last 24 hours her symptoms of gotten progressively worse especially her hypoxia she went to [place] where she was found hypoxic was placed on 4 L of oxygen chest x-ray showed bilateral infiltrates CRP of 63 ferritin of 500 platelet count of 15 LFTs and creatinine are stable.  Assessment/Plan:   Acute respiratory failure with hypoxia due to pneumonia due to COVID-19: She is requiring 3 L of oxygen keep saturations above 94%. She is started empirically on IV Remdesivir and steroids. She relates that over the last 24 hours her respiratory status has improved. I did talk to her about Actemra and she would like to think about it.  Diabetes mellitus type 2: She was started on IV Solu-Medrol, A1c is pending. Hold all oral hypoglycemic agents.  Also long-acting insulin plus sliding scale.  Essential hypertension: Continue current regimen blood pressure is well controlled. Norvasc, HCTZ and lisinopril.  Hypokalemia: Replete orally check a magnesium level and be met tomorrow morning.  DVT prophylaxis: lovenox Family Communication:none Disposition Plan/Barrier to D/C: home once off oxygen Code Status:     Code Status Orders  (From admission, onward)         Start     Ordered   01/21/19 0234  Full code   Continuous     01/21/19 0239        Code Status History    This patient has a current code status but no historical code status.   Advance Care Planning Activity        IV Access:    Peripheral IV   Procedures and diagnostic studies:   Dg Chest Port 1 View  Result Date: 01/21/2019 CLINICAL DATA:  Worsening COVID-19 symptoms EXAM: PORTABLE CHEST 1 VIEW COMPARISON:  Yesterday FINDINGS: Patchy bilateral pulmonary opacity. Cardiomegaly with vascular pedicle widening. No edema, effusion or pneumothorax. IMPRESSION: Unchanged bilateral pneumonia. Electronically Signed   By: Monte Fantasia M.D.   On: 01/21/2019 06:49     Medical Consultants:    None.  Anti-Infectives:   IV Remdesivir  Subjective:    Lauren Yu she relates she feels about the same as yesterday.  Objective:    Vitals:   01/22/19 0031 01/22/19 0305 01/22/19 0422 01/22/19 0824  BP: (!) 152/88 (!) 141/76 138/88 124/75  Pulse:  88 66 81  Resp: (!) 21 19 (!) 21 (!) 21  Temp: 98.9 F (37.2 C) 98.2 F (36.8 C) 98 F (36.7 C) 98.3 F (36.8 C)  TempSrc: Oral Oral Oral Oral  SpO2: 96% 94% 97% 94%  Weight: 95.2 kg     Height: '5\' 6"'  (1.676 m)      SpO2: 94 % O2 Flow Rate (L/min): 3 L/min   Intake/Output Summary (Last 24 hours) at 01/22/2019 1014 Last data filed at 01/21/2019 2000 Gross per 24 hour  Intake 0 ml  Output 3 ml  Net -3 ml   Filed Weights   01/21/19 0800 01/22/19 0031  Weight: 95.3 kg 95.2 kg    Exam: General exam: In no acute distress. Respiratory system: Good air movement and diffuse crackles bilaterally. Cardiovascular system: S1 & S2 heard, RRR. No JVD. Gastrointestinal system: Abdomen is nondistended, soft and nontender.  Central nervous system: Alert and oriented. No focal neurological deficits. Extremities: No pedal edema. Skin: No rashes, lesions or ulcers Psychiatry: Judgement and insight appear normal. Mood & affect appropriate.    Data Reviewed:    Labs:  Basic Metabolic Panel: Recent Labs  Lab 01/21/19 0633  NA 138  K 3.3*  CL 98  CO2 30  GLUCOSE 176*  BUN 14  CREATININE 0.86  CALCIUM 8.2*   GFR Estimated Creatinine Clearance: 87 mL/min (by C-G formula based on SCr of 0.86 mg/dL). Liver Function Tests: Recent Labs  Lab 01/21/19 0633  AST 20  ALT 19  ALKPHOS 36*  BILITOT 0.3  PROT 6.0*  ALBUMIN 2.6*   No results for input(s): LIPASE, AMYLASE in the last 168 hours. No results for input(s): AMMONIA in the last 168 hours. Coagulation profile No results for input(s): INR, PROTIME in the last 168 hours. COVID-19 Labs  Recent Labs    01/21/19 0633 01/21/19 1328  DDIMER  --  0.58*  FERRITIN 539*  --   CRP 10.3*  --     Lab Results  Component Value Date   SARSCOV2NAA POSITIVE (A) 01/21/2019    CBC: Recent Labs  Lab 01/21/19 0633  WBC 5.9  NEUTROABS 3.8  HGB 11.1*  HCT 33.3*  MCV 90.7  PLT 108*   Cardiac Enzymes: No results for input(s): CKTOTAL, CKMB, CKMBINDEX, TROPONINI in the last 168 hours. BNP (last 3 results) No results for input(s): PROBNP in the last 8760 hours. CBG: Recent Labs  Lab 01/21/19 0831 01/21/19 1134 01/21/19 1636 01/22/19 0824  GLUCAP 137* 276* 332* 225*   D-Dimer: Recent Labs    01/21/19 1328  DDIMER 0.58*   Hgb A1c: Recent Labs    01/21/19 0633  HGBA1C 8.4*   Lipid Profile: No results for input(s): CHOL, HDL, LDLCALC, TRIG, CHOLHDL, LDLDIRECT in the last 72 hours. Thyroid function studies: No results for input(s): TSH, T4TOTAL, T3FREE, THYROIDAB in the last 72 hours.  Invalid input(s): FREET3 Anemia work up: Recent Labs    01/21/19 [place]*   Sepsis Labs: Recent Labs  Lab 01/21/19 0633  PROCALCITON <0.10  WBC 5.9   Microbiology Recent Results (from the past 240 hour(s))  MRSA PCR Screening     Status: None   Collection Time: 01/21/19  1:55 AM   Specimen: Nasal Mucosa; Nasopharyngeal  Result Value Ref Range Status   MRSA by PCR NEGATIVE  NEGATIVE Final    Comment:        The GeneXpert MRSA Assay (FDA approved for NASAL specimens only), is one component of a comprehensive MRSA colonization surveillance program. It is not intended to diagnose MRSA infection nor to guide or monitor treatment for MRSA infections. Performed at [facility], [place] [address], [place], [place] [address]   SARS Coronavirus 2 [facility] order, Performed in [facility] hospital lab) Nasopharyngeal Nasopharyngeal Swab     Status: Abnormal   Collection Time: 01/21/19  2:46 AM   Specimen: Nasopharyngeal Swab  Result Value Ref Range Status   SARS Coronavirus 2 POSITIVE (A) NEGATIVE Final    Comment: CRITICAL RESULT CALLED TO, READ  BACK BY AND VERIFIED WITH: Scanlon [id number] @ [address] (NOTE) If result is NEGATIVE SARS-CoV-2 target nucleic acids are NOT DETECTED. The SARS-CoV-2 RNA is generally detectable in upper and lower  respiratory specimens during the acute phase of infection. The lowest  concentration of SARS-CoV-2 viral copies this assay can detect is 250  copies / mL. A negative result does not preclude SARS-CoV-2 infection  and should not be used as the sole basis for treatment or other  patient management decisions.  A negative result may occur with  improper specimen collection / handling, submission of specimen other  than nasopharyngeal swab, presence of viral mutation(s) within the  areas targeted by this assay, and inadequate number of viral copies  (<250 copies / mL). A negative result must be combined with clinical  observations, patient history, and epidemiological information. If result is POSITIVE SARS-CoV-2 target nucleic acids are  DETECTED. The SARS-CoV-2 RNA is generally detectable in upper and lower  respiratory specimens during the acute phase of infection.  Positive  results are indicative of active infection with SARS-CoV-2.  Clinical  correlation with patient history and other  diagnostic information is  necessary to determine patient infection status.  Positive results do  not rule out bacterial infection or co-infection with other viruses. If result is PRESUMPTIVE POSTIVE SARS-CoV-2 nucleic acids MAY BE PRESENT.   A presumptive positive result was obtained on the submitted specimen  and confirmed on repeat testing.  While 2019 novel coronavirus  (SARS-CoV-2) nucleic acids may be present in the submitted sample  additional confirmatory testing may be necessary for epidemiological  and / or clinical management purposes  to differentiate between  SARS-CoV-2 and other Sarbecovirus currently known to infect humans.  If clinically indicated additional testing with an alternate test  methodology  [id number]) is advised. The SARS-CoV-2 RNA is generally  detectable in upper and lower respiratory specimens during the acute  phase of infection. The expected result is Negative. Fact Sheet for Patients:  StrictlyIdeas.no Fact Sheet for Healthcare Providers: BankingDealers.co.za This test is not yet approved or cleared by the [place] FDA and has been authorized for detection and/or diagnosis of SARS-CoV-2 by FDA under an Emergency Use Authorization (EUA).  This EUA will remain in effect (meaning this test can be used) for the duration of the COVID-19 declaration under Section 564(b)(1) of the Act, 21 U.S.C. section 360bbb-3(b)(1), unless the authorization is terminated or revoked sooner. Performed at [facility], [place] [address], [place], [place] [address]      Medications:   . amLODipine  2.5 mg Oral Daily  . Chlorhexidine Gluconate Cloth  6 each Topical Daily  . enoxaparin (LOVENOX) injection  40 mg Subcutaneous Q24H  . hydrochlorothiazide  25 mg Oral Daily  . insulin aspart  0-15 Units Subcutaneous TID WC  . lisinopril  40 mg Oral Daily  . mouth rinse  15 mL Mouth Rinse BID  .  methylPREDNISolone (SOLU-MEDROL) injection  40 mg Intravenous Q12H  . venlafaxine XR  150 mg Oral Q breakfast  . vitamin C  500 mg Oral Daily  . zinc sulfate  220 mg Oral Daily   Continuous Infusions: . remdesivir 100 mg in NS 250 mL       LOS: 1 day   Charlynne Cousins  [facility]  01/22/2019, 10:14 AM

## 2019-01-23 LAB — CBC
HCT: 34.6 % — ABNORMAL LOW (ref 36.0–46.0)
Hemoglobin: 11.5 g/dL — ABNORMAL LOW (ref 12.0–15.0)
MCH: 30.1 pg (ref 26.0–34.0)
MCHC: 33.2 g/dL (ref 30.0–36.0)
MCV: 90.6 fL (ref 80.0–100.0)
Platelets: 148 10*3/uL — ABNORMAL LOW (ref 150–400)
RBC: 3.82 MIL/uL — ABNORMAL LOW (ref 3.87–5.11)
RDW: 12.5 % (ref 11.5–15.5)
WBC: 6.6 10*3/uL (ref 4.0–10.5)
nRBC: 0 % (ref 0.0–0.2)

## 2019-01-23 LAB — COMPREHENSIVE METABOLIC PANEL
ALT: 17 U/L (ref 0–44)
AST: 19 U/L (ref 15–41)
Albumin: 2.7 g/dL — ABNORMAL LOW (ref 3.5–5.0)
Alkaline Phosphatase: 41 U/L (ref 38–126)
Anion gap: 11 (ref 5–15)
BUN: 20 mg/dL (ref 6–20)
CO2: 31 mmol/L (ref 22–32)
Calcium: 9.2 mg/dL (ref 8.9–10.3)
Chloride: 96 mmol/L — ABNORMAL LOW (ref 98–111)
Creatinine, Ser: 0.7 mg/dL (ref 0.44–1.00)
GFR calc Af Amer: >60 mL/min (ref >60)
GFR calc non Af Amer: >60 mL/min (ref >60)
Glucose, Bld: 198 mg/dL — ABNORMAL HIGH (ref 70–99)
Potassium: 3.7 mmol/L (ref 3.5–5.1)
Sodium: 138 mmol/L (ref 135–145)
Total Bilirubin: 0.4 mg/dL (ref 0.3–1.2)
Total Protein: 6.7 g/dL (ref 6.5–8.1)

## 2019-01-23 LAB — GLUCOSE, CAPILLARY
Glucose-Capillary: 138 mg/dL — ABNORMAL HIGH (ref 70–99)
Glucose-Capillary: 314 mg/dL — ABNORMAL HIGH (ref 70–99)
Glucose-Capillary: 371 mg/dL — ABNORMAL HIGH (ref 70–99)
Glucose-Capillary: 422 mg/dL — ABNORMAL HIGH (ref 70–99)

## 2019-01-23 LAB — C-REACTIVE PROTEIN: CRP: 10.3 mg/dL — ABNORMAL HIGH (ref <1.0)

## 2019-01-23 LAB — FERRITIN: Ferritin: 801 ng/mL — ABNORMAL HIGH (ref 11–307)

## 2019-01-23 LAB — D-DIMER, QUANTITATIVE: D-Dimer, Quant: 2.8 ug/mL-FEU — ABNORMAL HIGH (ref 0.00–0.50)

## 2019-01-23 MED ORDER — POTASSIUM CHLORIDE CRYS ER 20 MEQ PO TBCR
20.0000 meq | EXTENDED_RELEASE_TABLET | Freq: Every day | ORAL | Status: DC
  Administered 2019-01-23: 20 meq via ORAL
  Filled 2019-01-23: qty 1

## 2019-01-23 MED ORDER — FUROSEMIDE 20 MG PO TABS
40.0000 mg | ORAL_TABLET | Freq: Every day | ORAL | Status: DC
  Administered 2019-01-23 – 2019-01-24 (×2): 40 mg via ORAL
  Filled 2019-01-23 (×2): qty 2

## 2019-01-23 NOTE — Progress Notes (Signed)
Family updated, agreeable with plan of care. 

## 2019-01-23 NOTE — Progress Notes (Signed)
PROGRESS NOTE                                                                                                                                                                                                             Patient Demographics:    Lauren Yu, is a 53 y.o. female, DOB - [birth year], LNL:[id number]  Outpatient Primary MD for the patient is Suzan Garibaldi, FNP   Admit date - 01/21/2019   LOS - 2  No chief complaint on file.      Brief Narrative: Patient is a 53 y.o. female with PMHx of HTN, DM, breast cancer s/p mastectomy, adjuvant radiation/chemotherapy-currently on tamoxifen-presenting with acute hypoxemic respiratory failure in the setting of COVID-19 pneumonia.   Subjective:    [name] today slightly better than yesterday-remains stable on 3 L of oxygen.   Assessment  & Plan :   Acute Hypoxic Resp Failure due to Covid 19 Viral pneumonia: Remains a stable-currently on 3 L of oxygen.  Continue steroids/Remdesivir.  If worsens-we will start Actemra-she has consented.  Note: Rationale, benefits, adverse effects of Actemra was discussed with patient in great detail.  Fever: febrile  O2 requirements: On 3 l/m (was on 3-4 L yesterday)  COVID-19 Labs: Recent Labs    01/21/19 0633 01/21/19 1328 01/23/19 0250  DDIMER  --  0.58* 2.80*  FERRITIN 539*  --  801*  CRP 10.3*  --  10.3*    COVID-19 Medications: Steroids: Solu-Medrol 8/17>> Remdesivir:8/17>> Actemra:see above Convalescent Plasma:N/A Research Studies:N/A  Other medications: Diuretics:although euvolemic-will change HCTZ to Lasix-in order to maintain negative balance. Antibiotics:Not needed as no evidence of bacterial infection  ProneIncentive Spirometry: encouraged patient to lie prone for 3-4 hours at a time for a total of 16 hours a day, and to encourage incentive spirometry use 3-4/hour.  DVT Prophylaxis  :  Lovenox    Prone/Incentive Spirometry: encouraged patient to lie prone for 3-4 hours at a time for a total of 16 hours a day, and to encourage incentive spirometry use 3-4/hour.  HTN: Controlled-continue with amlodipine, lisinopril-switching from HCTZ to Lasix-follow.  DM 2: CBGs stable this morning-but fluctuating yesterday-continue with Levemir 20 units nightly, 6 units of NovoLog with meals and SSI-we will follow CBGs and adjust accordingly  Obesity: Estimated body mass index is 33.88 kg/m as calculated from the following:  Height as of this encounter: 5\' 6"  (1.676 m).   Weight as of this encounter: 95.2 kg.   ABG: No results found for: PHART, PCO2ART, PO2ART, HCO3, TCO2, ACIDBASEDEF, O2SAT  Vent Settings: N/A  Condition - Guarded  Family Communication  : None at bedside  Code Status :  Full Code  Diet :  Diet Order            Diet heart healthy/carb modified Room service appropriate? Yes; Fluid consistency: Thin  Diet effective now               Disposition Plan  :  Remain hospitalized-needs 2-3 days more before consideration of discharge  Consults  :  None  Procedures  :  None  Antibiotics  :    Anti-infectives (From admission, onward)   Start     Dose/Rate Route Frequency Ordered Stop   01/22/19 1200  remdesivir 100 mg in sodium chloride 0.9 % 250 mL IVPB     100 mg 500 mL/hr over 30 Minutes Intravenous Every 24 hours 01/21/19 1112 01/26/19 1159   01/21/19 1500  remdesivir 200 mg in sodium chloride 0.9 % 250 mL IVPB  Status:  Discontinued     200 mg 500 mL/hr over 30 Minutes Intravenous Once 01/21/19 1453 01/22/19 0110   01/21/19 1300  remdesivir 200 mg in sodium chloride 0.9 % 250 mL IVPB  Status:  Discontinued     200 mg 500 mL/hr over 30 Minutes Intravenous Once 01/21/19 1112 01/21/19 1724      Inpatient Medications  Scheduled Meds: . amLODipine  2.5 mg Oral Daily  . Chlorhexidine Gluconate Cloth  6 each Topical Daily  . enoxaparin (LOVENOX) injection  40  mg Subcutaneous Q24H  . hydrochlorothiazide  25 mg Oral Daily  . insulin aspart  0-20 Units Subcutaneous TID WC  . insulin aspart  0-5 Units Subcutaneous QHS  . insulin aspart  6 Units Subcutaneous TID WC  . insulin detemir  20 Units Subcutaneous QHS  . lisinopril  40 mg Oral Daily  . mouth rinse  15 mL Mouth Rinse BID  . methylPREDNISolone (SOLU-MEDROL) injection  40 mg Intravenous Q12H  . venlafaxine XR  150 mg Oral Q breakfast  . vitamin C  500 mg Oral Daily  . zinc sulfate  220 mg Oral Daily   Continuous Infusions: . remdesivir 100 mg in NS 250 mL Stopped (01/22/19 1400)   PRN Meds:.acetaminophen **OR** acetaminophen, albuterol, hydrALAZINE, ondansetron **OR** ondansetron (ZOFRAN) IV   Time Spent in minutes  25  See all Orders from today for further details   Oren Binet M.D on 01/23/2019 at 12:00 PM  To page go to [email or web address] - use universal password  [facility] -  Office  [phone]    Objective:   Vitals:   01/22/19 1930 01/23/19 0140 01/23/19 0433 01/23/19 0829  BP: (!) 148/80 140/81 132/77 119/77  Pulse: 91 77 66 80  Resp: (!) 21  19 (!) 29  Temp: 98.8 F (37.1 C) 98.4 F (36.9 C) 98.2 F (36.8 C) 97.7 F (36.5 C)  TempSrc: Oral Oral Oral Oral  SpO2: 93% 93% 92% (!) 81%  Weight:      Height:        Wt Readings from Last 3 Encounters:  01/22/19 95.2 kg     Intake/Output Summary (Last 24 hours) at 01/23/2019 1200 Last data filed at 01/23/2019 0936 Gross per 24 hour  Intake 1190 ml  Output 900 ml  Net 290 ml  Physical Exam Gen Exam:Alert awake-not in any distress HEENT:atraumatic, normocephalic Chest: B/L clear to auscultation anteriorly CVS:S1S2 regular Abdomen:soft non tender, non distended Extremities:no edema Neurology: Non focal Skin: no rash   Data Review:    CBC Recent Labs  Lab 01/21/19 0633 01/23/19 0250  WBC 5.9 6.6  HGB 11.1* 11.5*  HCT 33.3* 34.6*  PLT 108* 148*  MCV 90.7 90.6  MCH 30.2 30.1  MCHC  33.3 33.2  RDW 13.0 12.5  LYMPHSABS 1.8  --   MONOABS 0.3  --   EOSABS 0.0  --   BASOSABS 0.0  --     Chemistries  Recent Labs  Lab 01/21/19 0633 01/22/19 0250 01/23/19 0250  NA 138  --  138  K 3.3*  --  3.7  CL 98  --  96*  CO2 30  --  31  GLUCOSE 176*  --  198*  BUN 14  --  20  CREATININE 0.86  --  0.70  CALCIUM 8.2*  --  9.2  MG  --  1.7  --   AST 20  --  19  ALT 19  --  17  ALKPHOS 36*  --  41  BILITOT 0.3  --  0.4   ------------------------------------------------------------------------------------------------------------------ No results for input(s): CHOL, HDL, LDLCALC, TRIG, CHOLHDL, LDLDIRECT in the last 72 hours.  Lab Results  Component Value Date   HGBA1C 8.4 (H) 01/21/2019   ------------------------------------------------------------------------------------------------------------------ No results for input(s): TSH, T4TOTAL, T3FREE, THYROIDAB in the last 72 hours.  Invalid input(s): FREET3 ------------------------------------------------------------------------------------------------------------------ Recent Labs    01/21/19 0633 01/23/19 0250  FERRITIN 539* 801*    Coagulation profile No results for input(s): INR, PROTIME in the last 168 hours.  Recent Labs    01/21/19 1328 01/23/19 0250  DDIMER 0.58* 2.80*    Cardiac Enzymes No results for input(s): CKMB, TROPONINI, MYOGLOBIN in the last 168 hours.  Invalid input(s): CK ------------------------------------------------------------------------------------------------------------------ No results found for: BNP  Micro Results Recent Results (from the past 240 hour(s))  MRSA PCR Screening     Status: None   Collection Time: 01/21/19  1:55 AM   Specimen: Nasal Mucosa; Nasopharyngeal  Result Value Ref Range Status   MRSA by PCR NEGATIVE NEGATIVE Final    Comment:        The GeneXpert MRSA Assay (FDA approved for NASAL specimens only), is one component of a comprehensive MRSA  colonization surveillance program. It is not intended to diagnose MRSA infection nor to guide or monitor treatment for MRSA infections. Performed at [facility], [place] [address], [place], [place] [address]   SARS Coronavirus 2 [facility] order, Performed in [facility] hospital lab) Nasopharyngeal Nasopharyngeal Swab     Status: Abnormal   Collection Time: 01/21/19  2:46 AM   Specimen: Nasopharyngeal Swab  Result Value Ref Range Status   SARS Coronavirus 2 POSITIVE (A) NEGATIVE Final    Comment: CRITICAL RESULT CALLED TO, READ BACK BY AND VERIFIED WITH: [place] [id number] @ [place] (NOTE) If result is NEGATIVE SARS-CoV-2 target nucleic acids are NOT DETECTED. The SARS-CoV-2 RNA is generally detectable in upper and lower  respiratory specimens during the acute phase of infection. The lowest  concentration of SARS-CoV-2 viral copies this assay can detect is 250  copies / mL. A negative result does not preclude SARS-CoV-2 infection  and should not be used as the sole basis for treatment or other  patient management decisions.  A negative result may occur with  improper  specimen collection / handling, submission of specimen other  than nasopharyngeal swab, presence of viral mutation(s) within the  areas targeted by this assay, and inadequate number of viral copies  (<250 copies / mL). A negative result must be combined with clinical  observations, patient history, and epidemiological information. If result is POSITIVE SARS-CoV-2 target nucleic acids are  DETECTED. The SARS-CoV-2 RNA is generally detectable in upper and lower  respiratory specimens during the acute phase of infection.  Positive  results are indicative of active infection with SARS-CoV-2.  Clinical  correlation with patient history and other diagnostic information is  necessary to determine patient infection status.  Positive results do  not rule out bacterial infection or co-infection with  other viruses. If result is PRESUMPTIVE POSTIVE SARS-CoV-2 nucleic acids MAY BE PRESENT.   A presumptive positive result was obtained on the submitted specimen  and confirmed on repeat testing.  While 2019 novel coronavirus  (SARS-CoV-2) nucleic acids may be present in the submitted sample  additional confirmatory testing may be necessary for epidemiological  and / or clinical management purposes  to differentiate between  SARS-CoV-2 and other Sarbecovirus currently known to infect humans.  If clinically indicated additional testing with an alternate test  methodology  [phone]) is advised. The SARS-CoV-2 RNA is generally  detectable in upper and lower respiratory specimens during the acute  phase of infection. The expected result is Negative. Fact Sheet for Patients:  StrictlyIdeas.no Fact Sheet for Healthcare Providers: BankingDealers.co.za This test is not yet approved or cleared by the [place] FDA and has been authorized for detection and/or diagnosis of SARS-CoV-2 by FDA under an Emergency Use Authorization (EUA).  This EUA will remain in effect (meaning this test can be used) for the duration of the COVID-19 declaration under Section 564(b)(1) of the Act, 21 U.S.C. section 360bbb-3(b)(1), unless the authorization is terminated or revoked sooner. Performed at [facility], [place] [address] [place], [place] [address]     Radiology Reports Dg Chest Port 1 View  Result Date: 01/21/2019 CLINICAL DATA:  Worsening COVID-19 symptoms EXAM: PORTABLE CHEST 1 VIEW COMPARISON:  Yesterday FINDINGS: Patchy bilateral pulmonary opacity. Cardiomegaly with vascular pedicle widening. No edema, effusion or pneumothorax. IMPRESSION: Unchanged bilateral pneumonia. Electronically Signed   By: Monte Fantasia M.D.   On: 01/21/2019 06:49

## 2019-01-23 NOTE — Progress Notes (Signed)
Inpatient Diabetes Program Recommendations  AACE/ADA: New Consensus Statement on Inpatient Glycemic Control (2015)  Target Ranges:  Prepandial:   less than 140 mg/dL      Peak postprandial:   less than 180 mg/dL (1-2 hours)      Critically ill patients:  140 - 180 mg/dL   Lab Results  Component Value Date   GLUCAP 138 (H) 01/23/2019   HGBA1C 8.4 (H) 01/21/2019    Review of Glycemic Control Results for Lauren Yu, Lauren Yu (MRN [id number]) as of 01/23/2019 11:40  Ref. Range 01/22/2019 08:24 01/22/2019 11:57 01/22/2019 17:31 01/22/2019 21:15 01/23/2019 08:24  Glucose-Capillary Latest Ref Range: 70 - 99 mg/dL 225 (H) 378 (H) 359 (H) 411 (H) 138 (H)   Diabetes history: DM 2 Outpatient Diabetes medications: Glipizide 10 mg Daily, Janumet 50-1000 mg bid  Current orders for Inpatient glycemic control:  Levemir 20 units qhs Novolog 0-20 units tid Novolog 0-5 units qhs Novolog 6 units tid meal coverage  Solumedrol 40 mg Q12 hours  Inpatient Diabetes Program Recommendations:    Fasting glucose 138 mg/dl. Consider increasing Novolog meal coverage to 10 units.  Thanks,  Tama Headings RN, MSN, BC-ADM Inpatient Diabetes Coordinator Team Pager [phone] (8a-5p)

## 2019-01-24 LAB — CBC
HCT: 35.5 % — ABNORMAL LOW (ref 36.0–46.0)
Hemoglobin: 11.8 g/dL — ABNORMAL LOW (ref 12.0–15.0)
MCH: 29.6 pg (ref 26.0–34.0)
MCHC: 33.2 g/dL (ref 30.0–36.0)
MCV: 89 fL (ref 80.0–100.0)
Platelets: 172 10*3/uL (ref 150–400)
RBC: 3.99 MIL/uL (ref 3.87–5.11)
RDW: 12.3 % (ref 11.5–15.5)
WBC: 7.9 10*3/uL (ref 4.0–10.5)
nRBC: 0 % (ref 0.0–0.2)

## 2019-01-24 LAB — COMPREHENSIVE METABOLIC PANEL
ALT: 18 U/L (ref 0–44)
AST: 18 U/L (ref 15–41)
Albumin: 2.8 g/dL — ABNORMAL LOW (ref 3.5–5.0)
Alkaline Phosphatase: 43 U/L (ref 38–126)
Anion gap: 13 (ref 5–15)
BUN: 25 mg/dL — ABNORMAL HIGH (ref 6–20)
CO2: 32 mmol/L (ref 22–32)
Calcium: 9.3 mg/dL (ref 8.9–10.3)
Chloride: 94 mmol/L — ABNORMAL LOW (ref 98–111)
Creatinine, Ser: 0.6 mg/dL (ref 0.44–1.00)
GFR calc Af Amer: >60 mL/min (ref >60)
GFR calc non Af Amer: >60 mL/min (ref >60)
Glucose, Bld: 161 mg/dL — ABNORMAL HIGH (ref 70–99)
Potassium: 3.3 mmol/L — ABNORMAL LOW (ref 3.5–5.1)
Sodium: 139 mmol/L (ref 135–145)
Total Bilirubin: 0.5 mg/dL (ref 0.3–1.2)
Total Protein: 6.8 g/dL (ref 6.5–8.1)

## 2019-01-24 LAB — GLUCOSE, CAPILLARY
Glucose-Capillary: 109 mg/dL — ABNORMAL HIGH (ref 70–99)
Glucose-Capillary: 153 mg/dL — ABNORMAL HIGH (ref 70–99)
Glucose-Capillary: 315 mg/dL — ABNORMAL HIGH (ref 70–99)
Glucose-Capillary: 361 mg/dL — ABNORMAL HIGH (ref 70–99)

## 2019-01-24 LAB — FERRITIN: Ferritin: 594 ng/mL — ABNORMAL HIGH (ref 11–307)

## 2019-01-24 LAB — C-REACTIVE PROTEIN: CRP: 5.5 mg/dL — ABNORMAL HIGH (ref <1.0)

## 2019-01-24 LAB — D-DIMER, QUANTITATIVE: D-Dimer, Quant: <0.27 ug/mL-FEU (ref 0.00–0.50)

## 2019-01-24 MED ORDER — POTASSIUM CHLORIDE CRYS ER 20 MEQ PO TBCR
40.0000 meq | EXTENDED_RELEASE_TABLET | Freq: Once | ORAL | Status: AC
  Administered 2019-01-24: 40 meq via ORAL
  Filled 2019-01-24: qty 2

## 2019-01-24 MED ORDER — HYDROCOD POLST-CPM POLST ER 10-8 MG/5ML PO SUER: 5.0000 mL | Freq: Two times a day (BID) | ORAL | Status: DC | PRN

## 2019-01-24 MED ORDER — INSULIN ASPART 100 UNIT/ML ~~LOC~~ SOLN
10.0000 [IU] | Freq: Three times a day (TID) | SUBCUTANEOUS | Status: DC
  Administered 2019-01-24 – 2019-01-26 (×6): 10 [IU] via SUBCUTANEOUS

## 2019-01-24 NOTE — Progress Notes (Addendum)
PROGRESS NOTE                                                                                                                                                                                                             Patient Demographics:    Lauren Yu, is a 53 y.o. female, DOB - 1965/11/07, GBT:[id number]  Outpatient Primary MD for the patient is Suzan Garibaldi, FNP   Admit date - 01/21/2019   LOS - 3  No chief complaint on file.      Brief Narrative: Patient is a 53 y.o. female with PMHx of HTN, DM, breast cancer s/p mastectomy, adjuvant radiation/chemotherapy-currently on tamoxifen-presenting with acute hypoxemic respiratory failure in the setting of COVID-19 pneumonia.   Subjective:    Lauren Yu today pains without any major complaints-she slept well overnight.  She remains on 2 L of oxygen this morning.   Assessment  & Plan :   Acute Hypoxic Resp Failure due to Covid 19 Viral pneumonia: Remains stable-on 3 L of oxygen.  Continue steroids/Remdesivir-since not worsening-continue to hold off on Actemra.    Note: Rationale, benefits, adverse effects of Actemra was discussed with patient in great detail.  Fever: afebrile  O2 requirements: On 3 l/m (was on 3-4 L yesterday)  COVID-19 Labs: Recent Labs    01/21/19 1328 01/23/19 0250 01/24/19 0340  DDIMER 0.58* 2.80* <0.27  FERRITIN  --  801* 594*  CRP  --  10.3* 5.5*    COVID-19 Medications: Steroids: Solu-Medrol 8/17>> Remdesivir:8/17>> Actemra:see above Convalescent Plasma:N/A Research Studies:N/A  Other medications: Diuretics: On oral Lasix-remains euvolemic.  Antibiotics:Not needed as no evidence of bacterial infection  Prone/Incentive Spirometry: encouraged patient to lie prone for 3-4 hours at a time for a total of 16 hours a day, and to encourage incentive spirometry use 3-4/hour.  DVT Prophylaxis  :  Lovenox   HTN: Controlled-continue  amlodipine, lisinopril and Lasix  DM 2: CBGs stable this morning-but still fluctuating and mostly on the higher side-increase pre-meal NovoLog to 10 units, continue resistant SSI-and continue Levemir 20 units nightly.  If hypoglycemia continues-we can add a.m. Levemir.   CBG (last 3)  Recent Labs    01/23/19 1602 01/23/19 2056 01/24/19 0828  GLUCAP 371* 422* 109*    Obesity: Estimated body mass index is 33.88 kg/m as calculated from the following:  Height as of this encounter: 5\' 6"  (1.676 m).   Weight as of this encounter: 95.2 kg.   ABG: No results found for: PHART, PCO2ART, PO2ART, HCO3, TCO2, ACIDBASEDEF, O2SAT  Vent Settings: N/A  Condition - Guarded  Family Communication  : Left voicemail for daughter on 8/20  Code Status :  Full Code  Diet :  Diet Order            Diet heart healthy/carb modified Room service appropriate? Yes; Fluid consistency: Thin  Diet effective now               Disposition Plan  :  Remain hospitalized-needs 2-3 days more before consideration of discharge  Consults  :  None  Procedures  :  None  Antibiotics  :    Anti-infectives (From admission, onward)   Start     Dose/Rate Route Frequency Ordered Stop   01/22/19 1200  remdesivir 100 mg in sodium chloride 0.9 % 250 mL IVPB     100 mg 500 mL/hr over 30 Minutes Intravenous Every 24 hours 01/21/19 1112 01/26/19 1159   01/21/19 1500  remdesivir 200 mg in sodium chloride 0.9 % 250 mL IVPB  Status:  Discontinued     200 mg 500 mL/hr over 30 Minutes Intravenous Once 01/21/19 1453 01/22/19 0110   01/21/19 1300  remdesivir 200 mg in sodium chloride 0.9 % 250 mL IVPB  Status:  Discontinued     200 mg 500 mL/hr over 30 Minutes Intravenous Once 01/21/19 1112 01/21/19 1724      Inpatient Medications  Scheduled Meds:  amLODipine  2.5 mg Oral Daily   Chlorhexidine Gluconate Cloth  6 each Topical Daily   enoxaparin (LOVENOX) injection  40 mg Subcutaneous Q24H   furosemide  40 mg  Oral Daily   insulin aspart  0-20 Units Subcutaneous TID WC   insulin aspart  0-5 Units Subcutaneous QHS   insulin aspart  6 Units Subcutaneous TID WC   insulin detemir  20 Units Subcutaneous QHS   lisinopril  40 mg Oral Daily   mouth rinse  15 mL Mouth Rinse BID   methylPREDNISolone (SOLU-MEDROL) injection  40 mg Intravenous Q12H   venlafaxine XR  150 mg Oral Q breakfast   vitamin C  500 mg Oral Daily   zinc sulfate  220 mg Oral Daily   Continuous Infusions:  remdesivir 100 mg in NS 250 mL Stopped (01/23/19 1405)   PRN Meds:.acetaminophen **OR** acetaminophen, albuterol, chlorpheniramine-HYDROcodone, hydrALAZINE, ondansetron **OR** ondansetron (ZOFRAN) IV   Time Spent in minutes  25  See all Orders from today for further details   Oren Binet M.D on 01/24/2019 at 10:07 AM  To page go to [email or web address] - use universal password  [facility] -  Office  [phone]    Objective:   Vitals:   01/23/19 1603 01/23/19 1946 01/23/19 2347 01/24/19 0430  BP: (!) 144/88 (!) 143/82  (!) 144/88  Pulse: 84 85 96 73  Resp: (!) 23 (!) 23 20 20   Temp: (!) 97.4 F (36.3 C) 97.9 F (36.6 C)  98.7 F (37.1 C)  TempSrc: Oral Oral  Oral  SpO2: 95% 100% 96% 97%  Weight:      Height:        Wt Readings from Last 3 Encounters:  01/22/19 95.2 kg     Intake/Output Summary (Last 24 hours) at 01/24/2019 1007 Last data filed at 01/23/2019 1801 Gross per 24 hour  Intake 480 ml  Output --  Net  480 ml     Physical Exam Gen Exam:Alert awake-not in any distress HEENT:atraumatic, normocephalic Chest: B/L rales at bases CVS:S1S2 regular Abdomen:soft non tender, non distended Extremities:no edema Neurology: Non focal Skin: no rash   Data Review:    CBC Recent Labs  Lab 01/21/19 0633 01/23/19 0250 01/24/19 0340  WBC 5.9 6.6 7.9  HGB 11.1* 11.5* 11.8*  HCT 33.3* 34.6* 35.5*  PLT 108* 148* 172  MCV 90.7 90.6 89.0  MCH 30.2 30.1 29.6  MCHC 33.3 33.2 33.2   RDW 13.0 12.5 12.3  LYMPHSABS 1.8  --   --   MONOABS 0.3  --   --   EOSABS 0.0  --   --   BASOSABS 0.0  --   --     Chemistries  Recent Labs  Lab 01/21/19 0633 01/22/19 0250 01/23/19 0250 01/24/19 0340  NA 138  --  138 139  K 3.3*  --  3.7 3.3*  CL 98  --  96* 94*  CO2 30  --  31 32  GLUCOSE 176*  --  198* 161*  BUN 14  --  20 25*  CREATININE 0.86  --  0.70 0.60  CALCIUM 8.2*  --  9.2 9.3  MG  --  1.7  --   --   AST 20  --  19 18  ALT 19  --  17 18  ALKPHOS 36*  --  41 43  BILITOT 0.3  --  0.4 0.5   ------------------------------------------------------------------------------------------------------------------ No results for input(s): CHOL, HDL, LDLCALC, TRIG, CHOLHDL, LDLDIRECT in the last 72 hours.  Lab Results  Component Value Date   HGBA1C 8.4 (H) 01/21/2019   ------------------------------------------------------------------------------------------------------------------ No results for input(s): TSH, T4TOTAL, T3FREE, THYROIDAB in the last 72 hours.  Invalid input(s): FREET3 ------------------------------------------------------------------------------------------------------------------ Recent Labs    01/23/19 0250 01/24/19 0340  FERRITIN 801* 594*    Coagulation profile No results for input(s): INR, PROTIME in the last 168 hours.  Recent Labs    01/23/19 0250 01/24/19 0340  DDIMER 2.80* <0.27    Cardiac Enzymes No results for input(s): CKMB, TROPONINI, MYOGLOBIN in the last 168 hours.  Invalid input(s): CK ------------------------------------------------------------------------------------------------------------------ No results found for: BNP  Micro Results Recent Results (from the past 240 hour(s))  MRSA PCR Screening     Status: None   Collection Time: 01/21/19  1:55 AM   Specimen: Nasal Mucosa; Nasopharyngeal  Result Value Ref Range Status   MRSA by PCR NEGATIVE NEGATIVE Final    Comment:        The GeneXpert MRSA Assay  (FDA approved for NASAL specimens only), is one component of a comprehensive MRSA colonization surveillance program. It is not intended to diagnose MRSA infection nor to guide or monitor treatment for MRSA infections. Performed at [facility], [place] [address], [place], [place] [address]   SARS Coronavirus 2 [facility] order, Performed in [facility] hospital lab) Nasopharyngeal Nasopharyngeal Swab     Status: Abnormal   Collection Time: 01/21/19  2:46 AM   Specimen: Nasopharyngeal Swab  Result Value Ref Range Status   SARS Coronavirus 2 POSITIVE (A) NEGATIVE Final    Comment: CRITICAL RESULT CALLED TO, READ BACK BY AND VERIFIED WITH: [place] [id number] @ [place] (NOTE) If result is NEGATIVE SARS-CoV-2 target nucleic acids are NOT DETECTED. The SARS-CoV-2 RNA is generally detectable in upper and lower  respiratory specimens during the acute phase of infection. The lowest  concentration of SARS-CoV-2 viral copies this  assay can detect is 250  copies / mL. A negative result does not preclude SARS-CoV-2 infection  and should not be used as the sole basis for treatment or other  patient management decisions.  A negative result may occur with  improper specimen collection / handling, submission of specimen other  than nasopharyngeal swab, presence of viral mutation(s) within the  areas targeted by this assay, and inadequate number of viral copies  (<250 copies / mL). A negative result must be combined with clinical  observations, patient history, and epidemiological information. If result is POSITIVE SARS-CoV-2 target nucleic acids are  DETECTED. The SARS-CoV-2 RNA is generally detectable in upper and lower  respiratory specimens during the acute phase of infection.  Positive  results are indicative of active infection with SARS-CoV-2.  Clinical  correlation with patient history and other diagnostic information is  necessary to determine patient infection  status.  Positive results do  not rule out bacterial infection or co-infection with other viruses. If result is PRESUMPTIVE POSTIVE SARS-CoV-2 nucleic acids MAY BE PRESENT.   A presumptive positive result was obtained on the submitted specimen  and confirmed on repeat testing.  While 2019 novel coronavirus  (SARS-CoV-2) nucleic acids may be present in the submitted sample  additional confirmatory testing may be necessary for epidemiological  and / or clinical management purposes  to differentiate between  SARS-CoV-2 and other Sarbecovirus currently known to infect humans.  If clinically indicated additional testing with an alternate test  methodology  [phone]) is advised. The SARS-CoV-2 RNA is generally  detectable in upper and lower respiratory specimens during the acute  phase of infection. The expected result is Negative. Fact Sheet for Patients:  StrictlyIdeas.no Fact Sheet for Healthcare Providers: BankingDealers.co.za This test is not yet approved or cleared by the [place] FDA and has been authorized for detection and/or diagnosis of SARS-CoV-2 by FDA under an Emergency Use Authorization (EUA).  This EUA will remain in effect (meaning this test can be used) for the duration of the COVID-19 declaration under Section 564(b)(1) of the Act, 21 U.S.C. section 360bbb-3(b)(1), unless the authorization is terminated or revoked sooner. Performed at [facility], [place] [address], [place], [place] [address]     Radiology Reports Dg Chest Port 1 View  Result Date: 01/21/2019 CLINICAL DATA:  Worsening COVID-19 symptoms EXAM: PORTABLE CHEST 1 VIEW COMPARISON:  Yesterday FINDINGS: Patchy bilateral pulmonary opacity. Cardiomegaly with vascular pedicle widening. No edema, effusion or pneumothorax. IMPRESSION: Unchanged bilateral pneumonia. Electronically Signed   By: Monte Fantasia M.D.   On: 01/21/2019 06:49

## 2019-01-24 NOTE — Progress Notes (Signed)
Family updated, agreeable with plan of care. 

## 2019-01-25 LAB — COMPREHENSIVE METABOLIC PANEL
ALT: 20 U/L (ref 0–44)
AST: 21 U/L (ref 15–41)
Albumin: 3.1 g/dL — ABNORMAL LOW (ref 3.5–5.0)
Alkaline Phosphatase: 48 U/L (ref 38–126)
Anion gap: 11 (ref 5–15)
BUN: 26 mg/dL — ABNORMAL HIGH (ref 6–20)
CO2: 32 mmol/L (ref 22–32)
Calcium: 9.7 mg/dL (ref 8.9–10.3)
Chloride: 96 mmol/L — ABNORMAL LOW (ref 98–111)
Creatinine, Ser: 0.73 mg/dL (ref 0.44–1.00)
GFR calc Af Amer: >60 mL/min (ref >60)
GFR calc non Af Amer: >60 mL/min (ref >60)
Glucose, Bld: 203 mg/dL — ABNORMAL HIGH (ref 70–99)
Potassium: 3.7 mmol/L (ref 3.5–5.1)
Sodium: 139 mmol/L (ref 135–145)
Total Bilirubin: 0.3 mg/dL (ref 0.3–1.2)
Total Protein: 7.4 g/dL (ref 6.5–8.1)

## 2019-01-25 LAB — CBC
HCT: 40.3 % (ref 36.0–46.0)
Hemoglobin: 13.5 g/dL (ref 12.0–15.0)
MCH: 29.7 pg (ref 26.0–34.0)
MCHC: 33.5 g/dL (ref 30.0–36.0)
MCV: 88.8 fL (ref 80.0–100.0)
Platelets: 221 10*3/uL (ref 150–400)
RBC: 4.54 MIL/uL (ref 3.87–5.11)
RDW: 12.4 % (ref 11.5–15.5)
WBC: 10.7 10*3/uL — ABNORMAL HIGH (ref 4.0–10.5)
nRBC: 0 % (ref 0.0–0.2)

## 2019-01-25 LAB — GLUCOSE, CAPILLARY
Glucose-Capillary: 130 mg/dL — ABNORMAL HIGH (ref 70–99)
Glucose-Capillary: 325 mg/dL — ABNORMAL HIGH (ref 70–99)
Glucose-Capillary: 262 mg/dL — ABNORMAL HIGH (ref 70–99)
Glucose-Capillary: 284 mg/dL — ABNORMAL HIGH (ref 70–99)

## 2019-01-25 LAB — D-DIMER, QUANTITATIVE: D-Dimer, Quant: <0.27 ug/mL-FEU (ref 0.00–0.50)

## 2019-01-25 LAB — FERRITIN: Ferritin: 589 ng/mL — ABNORMAL HIGH (ref 11–307)

## 2019-01-25 LAB — C-REACTIVE PROTEIN: CRP: 3.6 mg/dL — ABNORMAL HIGH (ref <1.0)

## 2019-01-25 MED ORDER — FUROSEMIDE 10 MG/ML IJ SOLN
40.0000 mg | Freq: Once | INTRAMUSCULAR | Status: AC
  Administered 2019-01-25: 40 mg via INTRAVENOUS
  Filled 2019-01-25: qty 4

## 2019-01-25 MED ORDER — PREDNISONE 20 MG PO TABS
40.0000 mg | ORAL_TABLET | Freq: Every day | ORAL | Status: DC
  Administered 2019-01-26: 40 mg via ORAL
  Filled 2019-01-25: qty 2

## 2019-01-25 NOTE — Progress Notes (Signed)
Physical Therapy Treatment Patient Details Name: Lauren Yu MRN: [id number] DOB: 02-26-1966 Today's Date: 01/25/2019    History of Present Illness 53 y.o. female with history of diabetes mellitus type 2, hypertension, hyperlipidemia has been experiencing productive cough shortness of breath headache generalized body ache over the last 1 week.  Tested +COVID by PCP. Presented to ED 01/21/19 with hypoxia and COVID pna    PT Comments    Pt is much more stead on her feet, but still requires supplemental O2 to keep sats at 88% during gait (see separate O2 note).  She is steady on her feet and wanting to go home.  We reviewed approprate in room standing exercises and IS use.  PT will continue to follow acutely for safe mobility progression   Follow Up Recommendations  No PT follow up     Equipment Recommendations  None recommended by PT    Recommendations for Other Services   NA     Precautions / Restrictions Precautions Precautions: Other (comment) Precaution Comments: watch sats    Mobility  Bed Mobility               General bed mobility comments: Pt was OOB in the recliner chair.   Transfers Overall transfer level: Modified independent Equipment used: None             General transfer comment: Mod I due to reliance on hands for transitions.   Ambulation/Gait Ambulation/Gait assistance: Supervision Gait Distance (Feet): 400 Feet Assistive device: None Gait Pattern/deviations: WFL(Within Functional Limits)     General Gait Details: solid, steady gait.  DOE 2/4 and pt unable to maintain sats without supplemental O2 as measrued by earlobe probe.           Balance Overall balance assessment: Needs assistance Sitting-balance support: Feet supported;No upper extremity supported Sitting balance-Leahy Scale: Good     Standing balance support: No upper extremity supported Standing balance-Leahy Scale: Good                               Cognition Arousal/Alertness: Awake/alert Behavior During Therapy: WFL for tasks assessed/performed Overall Cognitive Status: Within Functional Limits for tasks assessed                                           General Comments General comments (skin integrity, edema, etc.): Encouraged standing exercises at window (squats, toe raises, standing knee flexion as pt reports she has been walking independently in her room.       Pertinent Vitals/Pain Pain Assessment: No/denies pain           PT Goals (current goals can now be found in the care plan section) Progress towards PT goals: Progressing toward goals    Frequency    Min 3X/week      PT Plan Current plan remains appropriate       AM-PAC PT "6 Clicks" Mobility   Outcome Measure  Help needed turning from your back to your side while in a flat bed without using bedrails?: None Help needed moving from lying on your back to sitting on the side of a flat bed without using bedrails?: None Help needed moving to and from a bed to a chair (including a wheelchair)?: None Help needed standing up from a chair using your arms (e.g., wheelchair or bedside  chair)?: None Help needed to walk in hospital room?: None Help needed climbing 3-5 steps with a railing? : A Little 6 Click Score: 23    End of Session Equipment Utilized During Treatment: Oxygen(2) Activity Tolerance: Patient tolerated treatment well Patient left: in chair;with call bell/phone within reach   PT Visit Diagnosis: Difficulty in walking, not elsewhere classified (R26.2)     Time: [id number] PT Time Calculation (min) (ACUTE ONLY): 17 min  Charges:  $Gait Training: 8-22 mins          Kemet Nijjar B. Martavia Tye, PT, DPT  Acute Rehabilitation [phone] pager [phone] office  @ [facility]: [phone]            01/25/2019, 4:38 PM

## 2019-01-25 NOTE — Progress Notes (Signed)
Patient advised writer  Not to call to update Elwyn Lade (daughter) as she  had already done so.

## 2019-01-25 NOTE — Progress Notes (Signed)
SATURATION QUALIFICATIONS: (This note is used to comply with regulatory documentation for home oxygen)  Patient Saturations on Room Air at Rest = 90%  Patient Saturations on Room Air while Ambulating = 83%  Patient Saturations on 2 Liters of oxygen while Ambulating = 88%  Please briefly explain why patient needs home oxygen: Pt desaturates on RA with mobility.    Barbarann Ehlers Rossanna Spitzley, PT, DPT  Acute Rehabilitation [phone] pager [phone]) [phone] office  @ [facility]: [phone]

## 2019-01-25 NOTE — Progress Notes (Addendum)
PROGRESS NOTE                                                                                                                                                                                                             Patient Demographics:    Lauren Yu, is a 53 y.o. female, DOB - 1965-07-16, [id number]  Outpatient Primary MD for the patient is Suzan Garibaldi, FNP   Admit date - 01/21/2019   LOS - 4  No chief complaint on file.      Brief Narrative: Patient is a 53 y.o. female with PMHx of HTN, DM, breast cancer s/p mastectomy, adjuvant radiation/chemotherapy-currently on tamoxifen-presenting with acute hypoxemic respiratory failure in the setting of COVID-19 pneumonia.   Subjective:    Lauren Yu today feels better-Down to 2 L of oxygen.  Claims that she only gets very mild shortness of breath when she ambulates in the room.   Assessment  & Plan :   Acute Hypoxic Resp Failure due to Covid 19 Viral pneumonia: Improving-Down to 2 L of oxygen this morning.  Continue steroids-but transition to prednisone plans with a rapid taper in the next few days-she is still on Remdesivir.  No indication for Actemra as improving.   May require home O2-we will ask nurse to ambulate to see if she qualifies for oxygen.  Note: Rationale, benefits, adverse effects of Actemra was discussed with patient in great detail.  Fever: afebrile  O2 requirements: On 2 l/m (was on 3 L yesterday)  COVID-19 Labs: Recent Labs    01/23/19 0250 01/24/19 0340 01/25/19 0347  DDIMER 2.80* <0.27 <0.27  FERRITIN 801* 594* 589*  CRP 10.3* 5.5* 3.6*    COVID-19 Medications: Steroids: Solu-Medrol 8/17>> Remdesivir:8/17>> Actemra:see above Convalescent Plasma:N/A Research Studies:N/A  Other medications: Diuretics: Has developed 1+ pitting edema-change from oral to IV Lasix. Antibiotics:Not needed as no evidence of bacterial infection   Prone/Incentive Spirometry: encouraged patient to lie prone for 3-4 hours at a time for a total of 16 hours a day, and to encourage incentive spirometry use 3-4/hour.  DVT Prophylaxis  :  Lovenox   HTN: Controlled-continue amlodipine, lisinopril and Lasix  DM 2: CBGs still fluctuating-since steroids are being tapered down-we will continue with current regimen of Levemir 20 units nightly, 10 units of NovoLog with meals and resistant SSI.  Monitor for another  24 hours before adjusting.    CBG (last 3)  Recent Labs    01/24/19 1655 01/24/19 2115 01/25/19 0821  GLUCAP 315* 361* 130*    Obesity: Estimated body mass index is 33.88 kg/m as calculated from the following:   Height as of this encounter: 5\' 6"  (1.676 m).   Weight as of this encounter: 95.2 kg.   ABG: No results found for: PHART, PCO2ART, PO2ART, HCO3, TCO2, ACIDBASEDEF, O2SAT  Vent Settings: N/A  Condition -stable Family Communication  : Left voicemail for daughter on 8/21  Code Status :  Full Code  Diet :  Diet Order            Diet heart healthy/carb modified Room service appropriate? Yes; Fluid consistency: Thin  Diet effective now               Disposition Plan  :  Remain hospitalized-?  Home tomorrow if clinical improvement continues.  Consults  :  None  Procedures  :  None  Antibiotics  :    Anti-infectives (From admission, onward)   Start     Dose/Rate Route Frequency Ordered Stop   01/22/19 1200  remdesivir 100 mg in sodium chloride 0.9 % 250 mL IVPB     100 mg 500 mL/hr over 30 Minutes Intravenous Every 24 hours 01/21/19 1112 01/26/19 1159   01/21/19 1500  remdesivir 200 mg in sodium chloride 0.9 % 250 mL IVPB  Status:  Discontinued     200 mg 500 mL/hr over 30 Minutes Intravenous Once 01/21/19 1453 01/22/19 0110   01/21/19 1300  remdesivir 200 mg in sodium chloride 0.9 % 250 mL IVPB  Status:  Discontinued     200 mg 500 mL/hr over 30 Minutes Intravenous Once 01/21/19 1112 01/21/19 1724       Inpatient Medications  Scheduled Meds: . amLODipine  2.5 mg Oral Daily  . Chlorhexidine Gluconate Cloth  6 each Topical Daily  . enoxaparin (LOVENOX) injection  40 mg Subcutaneous Q24H  . insulin aspart  0-20 Units Subcutaneous TID WC  . insulin aspart  0-5 Units Subcutaneous QHS  . insulin aspart  10 Units Subcutaneous TID WC  . insulin detemir  20 Units Subcutaneous QHS  . lisinopril  40 mg Oral Daily  . mouth rinse  15 mL Mouth Rinse BID  . methylPREDNISolone (SOLU-MEDROL) injection  40 mg Intravenous Q12H  . venlafaxine XR  150 mg Oral Q breakfast  . vitamin C  500 mg Oral Daily  . zinc sulfate  220 mg Oral Daily   Continuous Infusions: . remdesivir 100 mg in NS 250 mL Stopped (01/24/19 1350)   PRN Meds:.acetaminophen **OR** acetaminophen, albuterol, chlorpheniramine-HYDROcodone, hydrALAZINE, ondansetron **OR** ondansetron (ZOFRAN) IV   Time Spent in minutes  25  See all Orders from today for further details   Oren Binet M.D on 01/25/2019 at 10:48 AM  To page go to [email or web address] - use universal password  [facility] -  Office  [phone]    Objective:   Vitals:   01/24/19 2340 01/25/19 0404 01/25/19 0639 01/25/19 0822  BP:  127/68    Pulse: 81 81 72   Resp: (!) 24 16 20    Temp:  98.1 F (36.7 C)  98 F (36.7 C)  TempSrc:    Oral  SpO2: 92% 93% 95%   Weight:      Height:        Wt Readings from Last 3 Encounters:  01/22/19 95.2 kg    No  intake or output data in the 24 hours ending 01/25/19 1048   Physical Exam Gen Exam:Alert awake-not in any distress HEENT:atraumatic, normocephalic Chest: B/L clear to auscultation anteriorly CVS:S1S2 regular Abdomen:soft non tender, non distended Extremities:no edema Neurology: Non focal Skin: no rash   Data Review:    CBC Recent Labs  Lab 01/21/19 0633 01/23/19 0250 01/24/19 0340 01/25/19 0347  WBC 5.9 6.6 7.9 10.7*  HGB 11.1* 11.5* 11.8* 13.5  HCT 33.3* 34.6* 35.5* 40.3  PLT 108*  148* 172 221  MCV 90.7 90.6 89.0 88.8  MCH 30.2 30.1 29.6 29.7  MCHC 33.3 33.2 33.2 33.5  RDW 13.0 12.5 12.3 12.4  LYMPHSABS 1.8  --   --   --   MONOABS 0.3  --   --   --   EOSABS 0.0  --   --   --   BASOSABS 0.0  --   --   --     Chemistries  Recent Labs  Lab 01/21/19 0633 01/22/19 0250 01/23/19 0250 01/24/19 0340 01/25/19 0347  NA 138  --  138 139 139  K 3.3*  --  3.7 3.3* 3.7  CL 98  --  96* 94* 96*  CO2 30  --  31 32 32  GLUCOSE 176*  --  198* 161* 203*  BUN 14  --  20 25* 26*  CREATININE 0.86  --  0.70 0.60 0.73  CALCIUM 8.2*  --  9.2 9.3 9.7  MG  --  1.7  --   --   --   AST 20  --  19 18 21   ALT 19  --  17 18 20   ALKPHOS 36*  --  41 43 48  BILITOT 0.3  --  0.4 0.5 0.3   ------------------------------------------------------------------------------------------------------------------ No results for input(s): CHOL, HDL, LDLCALC, TRIG, CHOLHDL, LDLDIRECT in the last 72 hours.  Lab Results  Component Value Date   HGBA1C 8.4 (H) 01/21/2019   ------------------------------------------------------------------------------------------------------------------ No results for input(s): TSH, T4TOTAL, T3FREE, THYROIDAB in the last 72 hours.  Invalid input(s): FREET3 ------------------------------------------------------------------------------------------------------------------ Recent Labs    01/24/19 0340 01/25/19 0347  FERRITIN 594* 589*    Coagulation profile No results for input(s): INR, PROTIME in the last 168 hours.  Recent Labs    01/24/19 0340 01/25/19 0347  DDIMER <0.27 <0.27    Cardiac Enzymes No results for input(s): CKMB, TROPONINI, MYOGLOBIN in the last 168 hours.  Invalid input(s): CK ------------------------------------------------------------------------------------------------------------------ No results found for: BNP  Micro Results Recent Results (from the past 240 hour(s))  MRSA PCR Screening     Status: None   Collection Time:  01/21/19  1:55 AM   Specimen: Nasal Mucosa; Nasopharyngeal  Result Value Ref Range Status   MRSA by PCR NEGATIVE NEGATIVE Final    Comment:        The GeneXpert MRSA Assay (FDA approved for NASAL specimens only), is one component of a comprehensive MRSA colonization surveillance program. It is not intended to diagnose MRSA infection nor to guide or monitor treatment for MRSA infections. Performed at [facility], [place] [address] [place], [place] [address]   SARS Coronavirus 2 [facility] order, Performed in [facility] hospital lab) Nasopharyngeal Nasopharyngeal Swab     Status: Abnormal   Collection Time: 01/21/19  2:46 AM   Specimen: Nasopharyngeal Swab  Result Value Ref Range Status   SARS Coronavirus 2 POSITIVE (A) NEGATIVE Final    Comment: CRITICAL RESULT CALLED TO, READ BACK BY AND VERIFIED WITH: S  HOEFLER,RN [id number] @ [facility] (NOTE) If result is NEGATIVE SARS-CoV-2 target nucleic acids are NOT DETECTED. The SARS-CoV-2 RNA is generally detectable in upper and lower  respiratory specimens during the acute phase of infection. The lowest  concentration of SARS-CoV-2 viral copies this assay can detect is 250  copies / mL. A negative result does not preclude SARS-CoV-2 infection  and should not be used as the sole basis for treatment or other  patient management decisions.  A negative result may occur with  improper specimen collection / handling, submission of specimen other  than nasopharyngeal swab, presence of viral mutation(s) within the  areas targeted by this assay, and inadequate number of viral copies  (<250 copies / mL). A negative result must be combined with clinical  observations, patient history, and epidemiological information. If result is POSITIVE SARS-CoV-2 target nucleic acids are  DETECTED. The SARS-CoV-2 RNA is generally detectable in upper and lower  respiratory specimens during the acute phase of infection.  Positive   results are indicative of active infection with SARS-CoV-2.  Clinical  correlation with patient history and other diagnostic information is  necessary to determine patient infection status.  Positive results do  not rule out bacterial infection or co-infection with other viruses. If result is PRESUMPTIVE POSTIVE SARS-CoV-2 nucleic acids MAY BE PRESENT.   A presumptive positive result was obtained on the submitted specimen  and confirmed on repeat testing.  While 2019 novel coronavirus  (SARS-CoV-2) nucleic acids may be present in the submitted sample  additional confirmatory testing may be necessary for epidemiological  and / or clinical management purposes  to differentiate between  SARS-CoV-2 and other Sarbecovirus currently known to infect humans.  If clinically indicated additional testing with an alternate test  methodology  [phone]) is advised. The SARS-CoV-2 RNA is generally  detectable in upper and lower respiratory specimens during the acute  phase of infection. The expected result is Negative. Fact Sheet for Patients:  StrictlyIdeas.no Fact Sheet for Healthcare Providers: BankingDealers.co.za This test is not yet approved or cleared by the [place] FDA and has been authorized for detection and/or diagnosis of SARS-CoV-2 by FDA under an Emergency Use Authorization (EUA).  This EUA will remain in effect (meaning this test can be used) for the duration of the COVID-19 declaration under Section 564(b)(1) of the Act, 21 U.S.C. section 360bbb-3(b)(1), unless the authorization is terminated or revoked sooner. Performed at [facility], [place] [address], [place], [place] [address]     Radiology Reports Dg Chest Port 1 View  Result Date: 01/21/2019 CLINICAL DATA:  Worsening COVID-19 symptoms EXAM: PORTABLE CHEST 1 VIEW COMPARISON:  Yesterday FINDINGS: Patchy bilateral pulmonary opacity. Cardiomegaly with  vascular pedicle widening. No edema, effusion or pneumothorax. IMPRESSION: Unchanged bilateral pneumonia. Electronically Signed   By: Monte Fantasia M.D.   On: 01/21/2019 06:49

## 2019-01-26 LAB — CBC
HCT: 36.6 % (ref 36.0–46.0)
Hemoglobin: 12.2 g/dL (ref 12.0–15.0)
MCH: 30 pg (ref 26.0–34.0)
MCHC: 33.3 g/dL (ref 30.0–36.0)
MCV: 89.9 fL (ref 80.0–100.0)
Platelets: 216 10*3/uL (ref 150–400)
RBC: 4.07 MIL/uL (ref 3.87–5.11)
RDW: 12.7 % (ref 11.5–15.5)
WBC: 16.3 10*3/uL — ABNORMAL HIGH (ref 4.0–10.5)
nRBC: 0.1 % (ref 0.0–0.2)

## 2019-01-26 LAB — FERRITIN: Ferritin: 487 ng/mL — ABNORMAL HIGH (ref 11–307)

## 2019-01-26 LAB — D-DIMER, QUANTITATIVE: D-Dimer, Quant: 0.31 ug/mL-FEU (ref 0.00–0.50)

## 2019-01-26 LAB — C-REACTIVE PROTEIN: CRP: 1.7 mg/dL — ABNORMAL HIGH (ref <1.0)

## 2019-01-26 LAB — GLUCOSE, CAPILLARY
Glucose-Capillary: 79 mg/dL (ref 70–99)
Glucose-Capillary: 238 mg/dL — ABNORMAL HIGH (ref 70–99)

## 2019-01-26 MED ORDER — FUROSEMIDE 10 MG/ML IJ SOLN
40.0000 mg | Freq: Once | INTRAMUSCULAR | Status: AC
  Administered 2019-01-26: 10:00:00 40 mg via INTRAVENOUS
  Filled 2019-01-26: qty 4

## 2019-01-26 MED ORDER — POTASSIUM CHLORIDE CRYS ER 20 MEQ PO TBCR
40.0000 meq | EXTENDED_RELEASE_TABLET | Freq: Once | ORAL | Status: AC
  Administered 2019-01-26: 40 meq via ORAL
  Filled 2019-01-26: qty 2

## 2019-01-26 MED ORDER — PREDNISONE 10 MG PO TABS: ORAL_TABLET | ORAL | 0 refills | Status: DC

## 2019-01-26 NOTE — Discharge Summary (Signed)
PATIENT DETAILS Name: Lauren Yu Age: 53 y.o. Sex: female Date of Birth: Sep 12, 1965 MRN: [id number]. Admitting Physician: Rise Patience, MD [id number], Malachy Mood, FNP  Admit Date: 01/21/2019 Discharge date: 01/26/2019  Recommendations for Outpatient Follow-up:  1. Follow up with PCP in 1-2 weeks 2. Please obtain BMP/CBC in one week 3. Please repeat chest x-ray in 4 weeks. 4. Please reassess need for oxygen at next visit  Admitted From:  Home  Disposition: [place]: No  Equipment/Devices: Oxygen 2L  Discharge Condition: Stable  CODE STATUS: FULL CODE  Diet recommendation:  Diet Order            Diet - low sodium heart healthy        Diet Carb Modified        Diet heart healthy/carb modified Room service appropriate? Yes; Fluid consistency: Thin  Diet effective now               Brief Summary: See H&P, Labs, Consult and Test reports for all details in brief, Patient is a 53 y.o. female with PMHx of HTN, DM, breast cancer s/p mastectomy, adjuvant radiation/chemotherapy-currently on tamoxifen-presenting with acute hypoxemic respiratory failure in the setting of COVID-19 pneumonia.  Brief Hospital Course: Acute Hypoxic Resp Failure due to Covid 19 Viral pneumonia:  Much improved-treated with steroids and Remdesivir.  Hypoxia never worsened-hence not given Actemra.  Inflammatory markers have down-trended.  She is requiring minimal oxygen at rest-at times seems to be tolerating room air at rest as well.  She desaturates mostly with ambulation.  She will be discharged home on 2 L and tapering prednisone.  Have asked her to follow-up with her primary care practitioner in 1 week to see if she still requires oxygen.    Fever: afebrile  O2 requirements: On 2 l/m (was on room air to 2 L/min-qualifies for Home O2)  COVID-19 Labs  Recent Labs    01/24/19 0340 01/25/19 0347 01/26/19 0311  DDIMER <0.27 <0.27 0.31  FERRITIN 594* 589* 487*  CRP  5.5* 3.6* 1.7*    Lab Results  Component Value Date   SARSCOV2NAA POSITIVE (A) 01/21/2019   COVID-19 Medications: Steroids: Solu-Medrol 8/17>>8/21, Prednisone taper 8/21>> Remdesivir:8/17>>8/21 Actemra:not indicated Convalescent Plasma:N/A Research Studies:N/A   HTN: Controlled-continue amlodipine, lisinopril and HCTZ  DM 2:  Required Levemir and SSI during this hospital stay-but since steroids have been rapidly tapered down-CBG this morning only in the 70s-suspect can be just placed back on her usual diabetic regimen on discharge.     CBG (last 3)  Recent Labs    01/25/19 1733 01/25/19 2056 01/26/19 0828  GLUCAP 262* 284* 79   Obesity: Estimated body mass index is 33.88 kg/m as calculated from the following:   Height as of this encounter: 5\' 6"  (1.676 m).   Weight as of this encounter: 95.2 kg.   Procedures/Studies: None  Discharge Diagnoses:  Principal Problem:   Acute respiratory failure with hypoxia (HCC) Active Problems:   Pneumonia due to COVID-19 virus   Controlled type 2 diabetes mellitus with hyperglycemia [facility])   Essential hypertension   Discharge Instructions:  Activity:  As tolerated   Discharge Instructions    Call MD for:  difficulty breathing, headache or visual disturbances   Complete by: As directed    Call MD for:  persistant dizziness or light-headedness   Complete by: As directed    Call MD for:  persistant nausea and vomiting   Complete by: As directed  Diet - low sodium heart healthy   Complete by: As directed    Diet Carb Modified   Complete by: As directed    Discharge instructions   Complete by: As directed    Follow with Primary MD  Suzan Garibaldi, FNP in 1-2 weeks  Please get a complete blood count and chemistry panel checked by your Primary MD at your next visit, and again as instructed by your Primary MD.  Please ask your primary care practitioner to repeat chest x-ray for 4 weeks.  Get Medicines reviewed and  adjusted: Please take all your medications with you for your next visit with your Primary MD  Laboratory/radiological data: Please request your Primary MD to go over all hospital tests and procedure/radiological results at the follow up, please ask your Primary MD to get all Hospital records sent to his/her office.  In some cases, they will be blood work, cultures and biopsy results pending at the time of your discharge. Please request that your primary care M.D. follows up on these results.  Also Note the following: If you experience worsening of your admission symptoms, develop shortness of breath, life threatening emergency, suicidal or homicidal thoughts you must seek medical attention immediately by calling 911 or calling your MD immediately  if symptoms less severe.  You must read complete instructions/literature along with all the possible adverse reactions/side effects for all the Medicines you take and that have been prescribed to you. Take any new Medicines after you have completely understood and accpet all the possible adverse reactions/side effects.   Do not drive when taking Pain medications or sleeping medications (Benzodaizepines)  Do not take more than prescribed Pain, Sleep and Anxiety Medications. It is not advisable to combine anxiety,sleep and pain medications without talking with your primary care practitioner  Special Instructions: If you have smoked or chewed Tobacco  in the last 2 yrs please stop smoking, stop any regular Alcohol  and or any Recreational drug use.  Wear Seat belts while driving.  Please note: You were cared for by a hospitalist during your hospital stay. Once you are discharged, your primary care physician will handle any further medical issues. Please note that NO REFILLS for any discharge medications will be authorized once you are discharged, as it is imperative that you return to your primary care physician (or establish a relationship with a  primary care physician if you do not have one) for your post hospital discharge needs so that they can reassess your need for medications and monitor your lab values.  ?   Person Under Monitoring Name: Alleyne Rudge  Location: [address] [place] [address]   Infection Prevention Recommendations for Individuals Confirmed to have, or Being Evaluated for, 2019 Novel Coronavirus (COVID-19) Infection Who Receive Care at Home  Individuals who are confirmed to have, or are being evaluated for, COVID-19 should follow the prevention steps below until a healthcare provider or local or state health department says they can return to normal activities.  Stay home except to get medical care You should restrict activities outside your home, except for getting medical care. Do not go to work, school, or public areas, and do not use public transportation or taxis.  Call ahead before visiting your doctor Before your medical appointment, call the healthcare provider and tell them that you have, or are being evaluated for, COVID-19 infection. This will help the healthcare providers office take steps to keep other people from getting infected. Ask your healthcare provider  to call the local or state health department.  Monitor your symptoms Seek prompt medical attention if your illness is worsening (e.g., difficulty breathing). Before going to your medical appointment, call the healthcare provider and tell them that you have, or are being evaluated for, COVID-19 infection. Ask your healthcare provider to call the local or state health department.  Wear a facemask You should wear a facemask that covers your nose and mouth when you are in the same room with other people and when you visit a healthcare provider. People who live with or visit you should also wear a facemask while they are in the same room with you.  Separate yourself from other people in your home As much as possible, you should stay  in a different room from other people in your home. Also, you should use a separate bathroom, if available.  Avoid sharing household items You should not share dishes, drinking glasses, cups, eating utensils, towels, bedding, or other items with other people in your home. After using these items, you should wash them thoroughly with soap and water.  Cover your coughs and sneezes Cover your mouth and nose with a tissue when you cough or sneeze, or you can cough or sneeze into your sleeve. Throw used tissues in a lined trash can, and immediately wash your hands with soap and water for at least 20 seconds or use an alcohol-based hand rub.  Wash your [facility] your hands often and thoroughly with soap and water for at least 20 seconds. You can use an alcohol-based hand sanitizer if soap and water are not available and if your hands are not visibly dirty. Avoid touching your eyes, nose, and mouth with unwashed hands.   Prevention Steps for Caregivers and Household Members of Individuals Confirmed to have, or Being Evaluated for, COVID-19 Infection Being Cared for in the Home  If you live with, or provide care at home for, a person confirmed to have, or being evaluated for, COVID-19 infection please follow these guidelines to prevent infection:  Follow healthcare providers instructions Make sure that you understand and can help the patient follow any healthcare provider instructions for all care.  Provide for the patients basic needs You should help the patient with basic needs in the home and provide support for getting groceries, prescriptions, and other personal needs.  Monitor the patients symptoms If they are getting sicker, call his or her medical provider and tell them that the patient has, or is being evaluated for, COVID-19 infection. This will help the healthcare providers office take steps to keep other people from getting infected. Ask the healthcare provider to call the  local or state health department.  Limit the number of people who have contact with the patient If possible, have only one caregiver for the patient. Other household members should stay in another home or place of residence. If this is not possible, they should stay in another room, or be separated from the patient as much as possible. Use a separate bathroom, if available. Restrict visitors who do not have an essential need to be in the home.  Keep older adults, very young children, and other sick people away from the patient Keep older adults, very young children, and those who have compromised immune systems or chronic health conditions away from the patient. This includes people with chronic heart, lung, or kidney conditions, diabetes, and cancer.  Ensure good ventilation Make sure that shared spaces in the home have good air flow, such  as from an air conditioner or an opened window, weather permitting.  Wash your hands often Wash your hands often and thoroughly with soap and water for at least 20 seconds. You can use an alcohol based hand sanitizer if soap and water are not available and if your hands are not visibly dirty. Avoid touching your eyes, nose, and mouth with unwashed hands. Use disposable paper towels to dry your hands. If not available, use dedicated cloth towels and replace them when they become wet.  Wear a facemask and gloves Wear a disposable facemask at all times in the room and gloves when you touch or have contact with the patients blood, body fluids, and/or secretions or excretions, such as sweat, saliva, sputum, nasal mucus, vomit, urine, or feces.  Ensure the mask fits over your nose and mouth tightly, and do not touch it during use. Throw out disposable facemasks and gloves after using them. Do not reuse. Wash your hands immediately after removing your facemask and gloves. If your personal clothing becomes contaminated, carefully remove clothing and launder. Wash  your hands after handling contaminated clothing. Place all used disposable facemasks, gloves, and other waste in a lined container before disposing them with other household waste. Remove gloves and wash your hands immediately after handling these items.  Do not share dishes, glasses, or other household items with the patient Avoid sharing household items. You should not share dishes, drinking glasses, cups, eating utensils, towels, bedding, or other items with a patient who is confirmed to have, or being evaluated for, COVID-19 infection. After the person uses these items, you should wash them thoroughly with soap and water.  Wash laundry thoroughly Immediately remove and wash clothes or bedding that have blood, body fluids, and/or secretions or excretions, such as sweat, saliva, sputum, nasal mucus, vomit, urine, or feces, on them. Wear gloves when handling laundry from the patient. Read and follow directions on labels of laundry or clothing items and detergent. In general, wash and dry with the warmest temperatures recommended on the label.  Clean all areas the individual has used often Clean all touchable surfaces, such as counters, tabletops, doorknobs, bathroom fixtures, toilets, phones, keyboards, tablets, and bedside tables, every day. Also, clean any surfaces that may have blood, body fluids, and/or secretions or excretions on them. Wear gloves when cleaning surfaces the patient has come in contact with. Use a diluted bleach solution (e.g., dilute bleach with 1 part bleach and 10 parts water) or a household disinfectant with a label that says EPA-registered for coronaviruses. To make a bleach solution at home, add 1 tablespoon of bleach to 1 quart (4 cups) of water. For a larger supply, add  cup of bleach to 1 gallon (16 cups) of water. Read labels of cleaning products and follow recommendations provided on product labels. Labels contain instructions for safe and effective use of the  cleaning product including precautions you should take when applying the product, such as wearing gloves or eye protection and making sure you have good ventilation during use of the product. Remove gloves and wash hands immediately after cleaning.  Monitor yourself for signs and symptoms of illness Caregivers and household members are considered close contacts, should monitor their health, and will be asked to limit movement outside of the home to the extent possible. Follow the monitoring steps for close contacts listed on the symptom monitoring form.   ? If you have additional questions, contact your local health department or call the epidemiologist on call at [phone] (  available 24/7). ? This guidance is subject to change. For the most up-to-date guidance from CDC, please refer to their website: YouBlogs.pl   Increase activity slowly   Complete by: As directed      Allergies as of 01/26/2019      Reactions   Penicillins Other (See Comments)   Unknown Did it involve swelling of the face/tongue/throat, SOB, or low BP? Unknown Did it involve sudden or severe rash/hives, skin peeling, or any reaction on the inside of your mouth or nose? Unknown Did you need to seek medical attention at a hospital or doctor's office? Unknown  When did it last happen?unknown  If all above answers are NO, may proceed with cephalosporin use.      Medication List    STOP taking these medications   neomycin-polymyxin-hydrocortisone OTIC solution Commonly known as: CORTISPORIN     TAKE these medications   acetaminophen 500 MG tablet Commonly known as: TYLENOL Take 1,000 mg by mouth every 6 (six) hours as needed for mild pain, moderate pain or headache.   amLODipine 2.5 MG tablet Commonly known as: NORVASC Take 2.5 mg by mouth daily.   ezetimibe 10 MG tablet Commonly known as: ZETIA Take 10 mg by mouth daily.     glipiZIDE 10 MG tablet Commonly known as: GLUCOTROL Take 10 mg by mouth daily before breakfast.   hydrochlorothiazide 25 MG tablet Commonly known as: HYDRODIURIL Take 25 mg by mouth daily.   Janumet 50-1000 MG tablet Generic drug: sitaGLIPtin-metformin Take 1 tablet by mouth 2 (two) times daily with a meal.   lisinopril 40 MG tablet Commonly known as: ZESTRIL Take 40 mg by mouth daily.   predniSONE 10 MG tablet Commonly known as: DELTASONE 30 mg daily for 1 day, 20 mg daily for 1 days,10 mg daily for 1 day, then stop   tamoxifen 20 MG tablet Commonly known as: NOLVADEX Take 20 mg by mouth daily.   venlafaxine XR 150 MG 24 hr capsule Commonly known as: EFFEXOR-XR Take 150 mg by mouth daily.            Durable Medical Equipment  (From admission, onward)         Start     Ordered   01/25/19 1103  For home use only DME oxygen  Once    Question Answer Comment  Length of Need 6 Months   Mode or (Route) Nasal cannula   Liters per Minute 2   Frequency Continuous (stationary and portable oxygen unit needed)   Oxygen conserving device Yes   Oxygen delivery system Gas      01/25/19 1102         Follow-up Information    Suzan Garibaldi, FNP. Schedule an appointment as soon as possible for a visit in 1 week(s).   Specialty: Nurse Practitioner Contact information: [place] [address] [phone]          Allergies  Allergen Reactions   Penicillins Other (See Comments)    Unknown Did it involve swelling of the face/tongue/throat, SOB, or low BP? Unknown Did it involve sudden or severe rash/hives, skin peeling, or any reaction on the inside of your mouth or nose? Unknown Did you need to seek medical attention at a hospital or doctor's office? Unknown  When did it last happen?unknown  If all above answers are NO, may proceed with cephalosporin use.      Consultations:   None   Other Procedures/Studies: Dg Chest Port 1  [address]  Result Date: 01/21/2019 CLINICAL DATA:  Worsening COVID-19 symptoms EXAM: PORTABLE CHEST 1 VIEW COMPARISON:  Yesterday FINDINGS: Patchy bilateral pulmonary opacity. Cardiomegaly with vascular pedicle widening. No edema, effusion or pneumothorax. IMPRESSION: Unchanged bilateral pneumonia. Electronically Signed   By: Monte Fantasia M.D.   On: 01/21/2019 06:49      TODAY-DAY OF DISCHARGE:  Subjective:   [name] today has no headache,no chest abdominal pain,no new weakness tingling or numbness, feels much better wants to go home today.   Objective:   Blood pressure 118/71, pulse 90, temperature 100.3 F (37.9 C), temperature source Axillary, resp. rate 20, height 5\' 6"  (1.676 m), weight 95.2 kg, SpO2 92 %.  Intake/Output Summary (Last 24 hours) at 01/26/2019 0923 Last data filed at 01/25/2019 2200 Gross per 24 hour  Intake 360 ml  Output 500 ml  Net -140 ml   Filed Weights   01/21/19 0800 01/22/19 0031  Weight: 95.3 kg 95.2 kg    Exam: Awake Alert, Oriented *3, No new F.N deficits, Normal affect [place].AT,PERRAL Supple Neck,No JVD, No cervical lymphadenopathy appriciated.  Symmetrical Chest wall movement, Good air movement bilaterally, CTAB RRR,No Gallops,Rubs or new Murmurs, No Parasternal Heave +ve B.Sounds, Abd Soft, Non tender, No organomegaly appriciated, No rebound -guarding or rigidity. No Cyanosis, Clubbing or edema, No new Rash or bruise   PERTINENT RADIOLOGIC STUDIES: Dg Chest Port 1 View  Result Date: 01/21/2019 CLINICAL DATA:  Worsening COVID-19 symptoms EXAM: PORTABLE CHEST 1 VIEW COMPARISON:  Yesterday FINDINGS: Patchy bilateral pulmonary opacity. Cardiomegaly with vascular pedicle widening. No edema, effusion or pneumothorax. IMPRESSION: Unchanged bilateral pneumonia. Electronically Signed   By: Monte Fantasia M.D.   On: 01/21/2019 06:49     PERTINENT LAB RESULTS: CBC: Recent Labs    01/25/19 0347 01/26/19 0311  WBC 10.7* 16.3*  HGB 13.5 12.2    HCT 40.3 36.6  PLT 221 216   CMET CMP     Component Value Date/Time   NA 139 01/25/2019 0347   K 3.7 01/25/2019 0347   CL 96 (L) 01/25/2019 0347   CO2 32 01/25/2019 0347   GLUCOSE 203 (H) 01/25/2019 0347   BUN 26 (H) 01/25/2019 0347   CREATININE 0.73 01/25/2019 0347   CALCIUM 9.7 01/25/2019 0347   PROT 7.4 01/25/2019 0347   ALBUMIN 3.1 (L) 01/25/2019 0347   AST 21 01/25/2019 0347   ALT 20 01/25/2019 0347   ALKPHOS 48 01/25/2019 0347   BILITOT 0.3 01/25/2019 0347   GFRNONAA >60 01/25/2019 0347   GFRAA >60 01/25/2019 0347    GFR Estimated Creatinine Clearance: 93.5 mL/min (by C-G formula based on SCr of 0.73 mg/dL). No results for input(s): LIPASE, AMYLASE in the last 72 hours. No results for input(s): CKTOTAL, CKMB, CKMBINDEX, TROPONINI in the last 72 hours. Invalid input(s): [place]    01/25/19 0347 01/26/19 0311  DDIMER <0.27 0.31   No results for input(s): HGBA1C in the last 72 hours. No results for input(s): CHOL, HDL, LDLCALC, TRIG, CHOLHDL, LDLDIRECT in the last 72 hours. No results for input(s): TSH, T4TOTAL, T3FREE, THYROIDAB in the last 72 hours.  Invalid input(s): FREET3 Recent Labs    01/25/19 0347 01/26/19 0311  FERRITIN 589* 487*   Coags: No results for input(s): INR in the last 72 hours.  Invalid input(s): PT Microbiology: Recent Results (from the past 240 hour(s))  MRSA PCR Screening     Status: None   Collection Time: 01/21/19  1:55 AM   Specimen: Nasal Mucosa; Nasopharyngeal  Result  Value Ref Range Status   MRSA by PCR NEGATIVE NEGATIVE Final    Comment:        The GeneXpert MRSA Assay (FDA approved for NASAL specimens only), is one component of a comprehensive MRSA colonization surveillance program. It is not intended to diagnose MRSA infection nor to guide or monitor treatment for MRSA infections. Performed at [facility], [place] [address] [place], [place] [address]   SARS Coronavirus 2 [facility]  order, Performed in [facility] hospital lab) Nasopharyngeal Nasopharyngeal Swab     Status: Abnormal   Collection Time: 01/21/19  2:46 AM   Specimen: Nasopharyngeal Swab  Result Value Ref Range Status   SARS Coronavirus 2 POSITIVE (A) NEGATIVE Final    Comment: CRITICAL RESULT CALLED TO, READ BACK BY AND VERIFIED WITH: [place] [id number] @ [place] (NOTE) If result is NEGATIVE SARS-CoV-2 target nucleic acids are NOT DETECTED. The SARS-CoV-2 RNA is generally detectable in upper and lower  respiratory specimens during the acute phase of infection. The lowest  concentration of SARS-CoV-2 viral copies this assay can detect is 250  copies / mL. A negative result does not preclude SARS-CoV-2 infection  and should not be used as the sole basis for treatment or other  patient management decisions.  A negative result may occur with  improper specimen collection / handling, submission of specimen other  than nasopharyngeal swab, presence of viral mutation(s) within the  areas targeted by this assay, and inadequate number of viral copies  (<250 copies / mL). A negative result must be combined with clinical  observations, patient history, and epidemiological information. If result is POSITIVE SARS-CoV-2 target nucleic acids are  DETECTED. The SARS-CoV-2 RNA is generally detectable in upper and lower  respiratory specimens during the acute phase of infection.  Positive  results are indicative of active infection with SARS-CoV-2.  Clinical  correlation with patient history and other diagnostic information is  necessary to determine patient infection status.  Positive results do  not rule out bacterial infection or co-infection with other viruses. If result is PRESUMPTIVE POSTIVE SARS-CoV-2 nucleic acids MAY BE PRESENT.   A presumptive positive result was obtained on the submitted specimen  and confirmed on repeat testing.  While 2019 novel coronavirus  (SARS-CoV-2) nucleic acids may be  present in the submitted sample  additional confirmatory testing may be necessary for epidemiological  and / or clinical management purposes  to differentiate between  SARS-CoV-2 and other Sarbecovirus currently known to infect humans.  If clinically indicated additional testing with an alternate test  methodology  [phone]) is advised. The SARS-CoV-2 RNA is generally  detectable in upper and lower respiratory specimens during the acute  phase of infection. The expected result is Negative. Fact Sheet for Patients:  StrictlyIdeas.no Fact Sheet for Healthcare Providers: BankingDealers.co.za This test is not yet approved or cleared by the [place] FDA and has been authorized for detection and/or diagnosis of SARS-CoV-2 by FDA under an Emergency Use Authorization (EUA).  This EUA will remain in effect (meaning this test can be used) for the duration of the COVID-19 declaration under Section 564(b)(1) of the Act, 21 U.S.C. section 360bbb-3(b)(1), unless the authorization is terminated or revoked sooner. Performed at [facility], [place] [address], [place], [place] [address]     FURTHER DISCHARGE INSTRUCTIONS:  Get Medicines reviewed and adjusted: Please take all your medications with you for your next visit with your Primary MD  Laboratory/radiological data: Please request your  Primary MD to go over all hospital tests and procedure/radiological results at the follow up, please ask your Primary MD to get all Hospital records sent to his/her office.  In some cases, they will be blood work, cultures and biopsy results pending at the time of your discharge. Please request that your primary care M.D. goes through all the records of your hospital data and follows up on these results.  Also Note the following: If you experience worsening of your admission symptoms, develop shortness of breath, life threatening emergency,  suicidal or homicidal thoughts you must seek medical attention immediately by calling 911 or calling your MD immediately  if symptoms less severe.  You must read complete instructions/literature along with all the possible adverse reactions/side effects for all the Medicines you take and that have been prescribed to you. Take any new Medicines after you have completely understood and accpet all the possible adverse reactions/side effects.   Do not drive when taking Pain medications or sleeping medications (Benzodaizepines)  Do not take more than prescribed Pain, Sleep and Anxiety Medications. It is not advisable to combine anxiety,sleep and pain medications without talking with your primary care practitioner  Special Instructions: If you have smoked or chewed Tobacco  in the last 2 yrs please stop smoking, stop any regular Alcohol  and or any Recreational drug use.  Wear Seat belts while driving.  Please note: You were cared for by a hospitalist during your hospital stay. Once you are discharged, your primary care physician will handle any further medical issues. Please note that NO REFILLS for any discharge medications will be authorized once you are discharged, as it is imperative that you return to your primary care physician (or establish a relationship with a primary care physician if you do not have one) for your post hospital discharge needs so that they can reassess your need for medications and monitor your lab values.  Total Time spent coordinating discharge including counseling, education and face to face time equals 35 minutes.  SignedOren Binet 01/26/2019 9:23 AM

## 2019-01-26 NOTE — Discharge Instructions (Signed)
Person Under Monitoring Name: Lauren Yu  Location: [address] [place] [address]   Infection Prevention Recommendations for Individuals Confirmed to have, or Being Evaluated for, 2019 Novel Coronavirus (COVID-19) Infection Who Receive Care at Home  Individuals who are confirmed to have, or are being evaluated for, COVID-19 should follow the prevention steps below until a healthcare provider or local or state health department says they can return to normal activities.  Stay home except to get medical care You should restrict activities outside your home, except for getting medical care. Do not go to work, school, or public areas, and do not use public transportation or taxis.  Call ahead before visiting your doctor Before your medical appointment, call the healthcare provider and tell them that you have, or are being evaluated for, COVID-19 infection. This will help the healthcare providers office take steps to keep other people from getting infected. Ask your healthcare provider to call the local or state health department.  Monitor your symptoms Seek prompt medical attention if your illness is worsening (e.g., difficulty breathing). Before going to your medical appointment, call the healthcare provider and tell them that you have, or are being evaluated for, COVID-19 infection. Ask your healthcare provider to call the local or state health department.  Wear a facemask You should wear a facemask that covers your nose and mouth when you are in the same room with other people and when you visit a healthcare provider. People who live with or visit you should also wear a facemask while they are in the same room with you.  Separate yourself from other people in your home As much as possible, you should stay in a different room from other people in your home. Also, you should use a separate bathroom, if available.  Avoid sharing household items You should not share  dishes, drinking glasses, cups, eating utensils, towels, bedding, or other items with other people in your home. After using these items, you should wash them thoroughly with soap and water.  Cover your coughs and sneezes Cover your mouth and nose with a tissue when you cough or sneeze, or you can cough or sneeze into your sleeve. Throw used tissues in a lined trash can, and immediately wash your hands with soap and water for at least 20 seconds or use an alcohol-based hand rub.  Wash your [facility] your hands often and thoroughly with soap and water for at least 20 seconds. You can use an alcohol-based hand sanitizer if soap and water are not available and if your hands are not visibly dirty. Avoid touching your eyes, nose, and mouth with unwashed hands.   Prevention Steps for Caregivers and Household Members of Individuals Confirmed to have, or Being Evaluated for, COVID-19 Infection Being Cared for in the Home  If you live with, or provide care at home for, a person confirmed to have, or being evaluated for, COVID-19 infection please follow these guidelines to prevent infection:  Follow healthcare providers instructions Make sure that you understand and can help the patient follow any healthcare provider instructions for all care.  Provide for the patients basic needs You should help the patient with basic needs in the home and provide support for getting groceries, prescriptions, and other personal needs.  Monitor the patients symptoms If they are getting sicker, call his or her medical provider and tell them that the patient has, or is being evaluated for, COVID-19 infection. This will help the healthcare providers office  take steps to keep other people from getting infected. Ask the healthcare provider to call the local or state health department.  Limit the number of people who have contact with the patient  If possible, have only one caregiver for the patient.  Other  household members should stay in another home or place of residence. If this is not possible, they should stay  in another room, or be separated from the patient as much as possible. Use a separate bathroom, if available.  Restrict visitors who do not have an essential need to be in the home.  Keep older adults, very young children, and other sick people away from the patient Keep older adults, very young children, and those who have compromised immune systems or chronic health conditions away from the patient. This includes people with chronic heart, lung, or kidney conditions, diabetes, and cancer.  Ensure good ventilation Make sure that shared spaces in the home have good air flow, such as from an air conditioner or an opened window, weather permitting.  Wash your hands often  Wash your hands often and thoroughly with soap and water for at least 20 seconds. You can use an alcohol based hand sanitizer if soap and water are not available and if your hands are not visibly dirty.  Avoid touching your eyes, nose, and mouth with unwashed hands.  Use disposable paper towels to dry your hands. If not available, use dedicated cloth towels and replace them when they become wet.  Wear a facemask and gloves  Wear a disposable facemask at all times in the room and gloves when you touch or have contact with the patients blood, body fluids, and/or secretions or excretions, such as sweat, saliva, sputum, nasal mucus, vomit, urine, or feces.  Ensure the mask fits over your nose and mouth tightly, and do not touch it during use.  Throw out disposable facemasks and gloves after using them. Do not reuse.  Wash your hands immediately after removing your facemask and gloves.  If your personal clothing becomes contaminated, carefully remove clothing and launder. Wash your hands after handling contaminated clothing.  Place all used disposable facemasks, gloves, and other waste in a lined container before  disposing them with other household waste.  Remove gloves and wash your hands immediately after handling these items.  Do not share dishes, glasses, or other household items with the patient  Avoid sharing household items. You should not share dishes, drinking glasses, cups, eating utensils, towels, bedding, or other items with a patient who is confirmed to have, or being evaluated for, COVID-19 infection.  After the person uses these items, you should wash them thoroughly with soap and water.  Wash laundry thoroughly  Immediately remove and wash clothes or bedding that have blood, body fluids, and/or secretions or excretions, such as sweat, saliva, sputum, nasal mucus, vomit, urine, or feces, on them.  Wear gloves when handling laundry from the patient.  Read and follow directions on labels of laundry or clothing items and detergent. In general, wash and dry with the warmest temperatures recommended on the label.  Clean all areas the individual has used often  Clean all touchable surfaces, such as counters, tabletops, doorknobs, bathroom fixtures, toilets, phones, keyboards, tablets, and bedside tables, every day. Also, clean any surfaces that may have blood, body fluids, and/or secretions or excretions on them.  Wear gloves when cleaning surfaces the patient has come in contact with.  Use a diluted bleach solution (e.g., dilute bleach with 1 part  bleach and 10 parts water) or a household disinfectant with a label that says EPA-registered for coronaviruses. To make a bleach solution at home, add 1 tablespoon of bleach to 1 quart (4 cups) of water. For a larger supply, add  cup of bleach to 1 gallon (16 cups) of water.  Read labels of cleaning products and follow recommendations provided on product labels. Labels contain instructions for safe and effective use of the cleaning product including precautions you should take when applying the product, such as wearing gloves or eye protection  and making sure you have good ventilation during use of the product.  Remove gloves and wash hands immediately after cleaning.  Monitor yourself for signs and symptoms of illness Caregivers and household members are considered close contacts, should monitor their health, and will be asked to limit movement outside of the home to the extent possible. Follow the monitoring steps for close contacts listed on the symptom monitoring form.   ? If you have additional questions, contact your local health department or call the epidemiologist on call at [phone] (available 24/7). ? This guidance is subject to change. For the most up-to-date guidance from [facility], please refer to their website: [email or web address]

## 2019-01-26 NOTE — TOC Transition Note (Signed)
Transition of Care [facility]) - CM/SW Discharge Note   Patient Details  Name: Lauren Yu MRN: [id number] Date of Birth: 10-28-1965  [facility] CM/SW Contact:  Ninfa Meeker, RN Phone Number: [phone] (working remotely) 01/26/2019, 11:52 AM   Clinical Narrative:   53 yr old female admitted and treated for COVID 19. Thankfully patient is improving and will discharge home with oxygen. Case manager spoke with patient via telephone to discuss oxygen need. Referral was called to Learta Codding, Huey Romans, Liaison. Case manager also confirmed patient's physical address: [email or web address],  . Her niece, Collie Siad [phone] will go to her home to accept oxygen concentrator. Patient's nephew will pick her up at discharge once oxygen has been delivered to her home. AC will deliver a portable tank to patient's room for transport home.        Final next level of care: Home/Self Care Barriers to Discharge: No Barriers Identified   Patient Goals and CMS Choice Patient states their goals for this hospitalization and ongoing recovery are:: get better   Choice offered to / list presented to : Patient  Discharge Placement                       Discharge Plan and Services   Discharge Planning Services: CM Consult Post Acute Care Choice: Durable Medical Equipment          DME Arranged: Oxygen DME Agency: [place] Date DME Agency Contacted: 01/26/19 Time DME Agency Contacted: [id number] Representative spoke with at DME Agency: Learta Codding [facility] Arranged: NA          Social Determinants of Health ([place]) Interventions     Readmission Risk Interventions No flowsheet data found.

## 2019-03-25 DIAGNOSIS — Z79810 Long term (current) use of selective estrogen receptor modulators (SERMs): Secondary | ICD-10-CM

## 2019-03-25 DIAGNOSIS — C50511 Malignant neoplasm of lower-outer quadrant of right female breast: Secondary | ICD-10-CM

## 2019-03-25 DIAGNOSIS — Z170 Estrogen receptor positive status [ER+]: Secondary | ICD-10-CM

## 2019-05-01 ENCOUNTER — Encounter: Payer: Self-pay | Admitting: Cardiology

## 2019-05-01 ENCOUNTER — Ambulatory Visit (INDEPENDENT_AMBULATORY_CARE_PROVIDER_SITE_OTHER): Payer: BC Managed Care – PPO | Admitting: Cardiology

## 2019-05-01 ENCOUNTER — Other Ambulatory Visit: Payer: Self-pay

## 2019-05-01 VITALS — BP 122/88 | HR 86 | Ht 66.0 in | Wt 212.8 lb

## 2019-05-01 DIAGNOSIS — I10 Essential (primary) hypertension: Secondary | ICD-10-CM | POA: Diagnosis not present

## 2019-05-01 DIAGNOSIS — I509 Heart failure, unspecified: Secondary | ICD-10-CM

## 2019-05-01 DIAGNOSIS — M7989 Other specified soft tissue disorders: Secondary | ICD-10-CM | POA: Insufficient documentation

## 2019-05-01 DIAGNOSIS — R0602 Shortness of breath: Secondary | ICD-10-CM

## 2019-05-01 DIAGNOSIS — E1165 Type 2 diabetes mellitus with hyperglycemia: Secondary | ICD-10-CM

## 2019-05-01 DIAGNOSIS — I38 Endocarditis, valve unspecified: Secondary | ICD-10-CM

## 2019-05-01 LAB — TROPONIN I: Troponin I: 0.02 ng/mL (ref 0.00–0.04)

## 2019-05-01 LAB — COMPREHENSIVE METABOLIC PANEL
ALT: 21 IU/L (ref 0–32)
AST: 28 IU/L (ref 0–40)
Albumin/Globulin Ratio: 1.3 (ref 1.2–2.2)
Albumin: 4.1 g/dL (ref 3.8–4.9)
Alkaline Phosphatase: 74 IU/L (ref 39–117)
BUN/Creatinine Ratio: 18 (ref 9–23)
BUN: 14 mg/dL (ref 6–24)
Bilirubin Total: 0.5 mg/dL (ref 0.0–1.2)
CO2: 27 mmol/L (ref 20–29)
Calcium: 9.6 mg/dL (ref 8.7–10.2)
Chloride: 94 mmol/L — ABNORMAL LOW (ref 96–106)
Creatinine, Ser: 0.76 mg/dL (ref 0.57–1.00)
GFR calc Af Amer: 103 mL/min/{1.73_m2} (ref >59)
GFR calc non Af Amer: 89 mL/min/{1.73_m2} (ref >59)
Globulin, Total: 3.1 g/dL (ref 1.5–4.5)
Glucose: 240 mg/dL — ABNORMAL HIGH (ref 65–99)
Potassium: 3.4 mmol/L — ABNORMAL LOW (ref 3.5–5.2)
Sodium: 139 mmol/L (ref 134–144)
Total Protein: 7.2 g/dL (ref 6.0–8.5)

## 2019-05-01 LAB — CBC
Hematocrit: 41.6 % (ref 34.0–46.6)
Hemoglobin: 14.2 g/dL (ref 11.1–15.9)
MCH: 30.5 pg (ref 26.6–33.0)
MCHC: 34.1 g/dL (ref 31.5–35.7)
MCV: 90 fL (ref 79–97)
Platelets: 149 10*3/uL — ABNORMAL LOW (ref 150–450)
RBC: 4.65 x10E6/uL (ref 3.77–5.28)
RDW: 13.1 % (ref 11.7–15.4)
WBC: 4.8 10*3/uL (ref 3.4–10.8)

## 2019-05-01 LAB — PRO B NATRIURETIC PEPTIDE: NT-Pro BNP: 153 pg/mL (ref 0–249)

## 2019-05-01 LAB — D-DIMER, QUANTITATIVE: D-DIMER: 0.25 mg/L FEU (ref 0.00–0.49)

## 2019-05-01 NOTE — Progress Notes (Signed)
Cardiology Office Note:    Date:  05/01/2019   ID:  Lauren Yu, DOB 1966-02-26, MRN [id number]  PCP:  Suzan Garibaldi, FNP  Cardiologist:  Shirlee More, MD   Referring MD: Suzan Garibaldi, FNP  ASSESSMENT:    1. 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 Essential hypertension   3. Controlled type 2 diabetes mellitus with hyperglycemia, without long-term current use of insulin (HCC)    PLAN:    In order of problems listed above:  1. She has a very concerning presentation here that appears to be heart failure but may represent myocarditis from COVID-19 pulmonary thromboembolism or edema due to calcium channel blocker.  She will stop her calcium channel blocker continue her loop diuretic today if possible we will do an echocardiogram and check full labs including proBNP D-dimer troponin CBC and a CMP.  If she has echo findings of pulmonary hypertension or if her D-dimer is elevated she will need a stat CTA for pulmonary thromboembolism. 2. For hypertension continue her current ACE inhibitor and thiazide diuretic stop calcium channel blocker confusing situation where can lead to edema 3. Stable diabetes on appropriate cardiac medications 4. Stable hyperlipidemia continue Zetia  Next appointment yes 6 weeks   Medication Adjustments/Labs and Tests Ordered: Current medicines are reviewed at length with the patient today.  Concerns regarding medicines are outlined above.  No orders of the defined types were placed in this encounter.  No orders of the defined types were placed in this encounter.    Chief Complaint  Patient presents with  . Congestive Heart Failure    I was unable to review records from her primary care physician before the visit.  I did look at records available in epic during her hospitalization in August when there is no documentation of congestive heart failure at that time.    History of Present Illness:    Lauren Yu is a 53 y.o. female who is being seen today for the evaluation of heart failure at the request of Suzan Garibaldi, [place].  She was discharged from [facility] 01/21/2019 after hospitalization with hypoxic respiratory failure and COVID-19 viral pneumonia.  Chest x-ray performed 01/21/2019 showed bilateral pneumonia.  2017 showed normal myocardial perfusion study performed at [facility] and was admitted overnight with complaints of rapid heart rhythm.  No arrhythmia was documented.  She had a follow-up chest x-ray 03/18/2019 [facility] healthcare that showed persistent peripheral opacities but improved from previous imaging felt to represent scarring.    She has not done well after Covid she is remained short of breath with bathing bending over and climbing stairs to go to work 2 flights of stairs she has stop at the top and rest she developed worsened edema and takes a diuretic about 3 times a week furosemide unspecified dosage it helps a little.  She has orthopnea 3 pillows and at times sits on the side of the bed to catch her breath.  She has no previous heart disease in 2017 had normal left ventricular function.  She does not think she had any cardiac complications in hospital no history of venous thromboembolism.  Her D-dimer was quite elevated when she was admitted to the hospital.  She just recently had her oxygen sent back and she gets saturations as low as 85% after bathing.  I accessed the [place] record from 01/20/2019.  She was short of breath at rest and hypoxic with a saturation  of 88% which quickly normalized with supplemental nasal oxygen.  At the ED visit she was febrile temperature of 99 2 and was mentioned breathless at rest her CBC was normal renal function was normal EKG showed a pattern of right bundle branch block there is no notation of the chest x-ray or CT scan being performed the ED and with COVID-19 and hypoxia was transferred to the [facility] for  admission. Past Medical History:  Diagnosis Date  . Cancer ([place])   . Diabetes mellitus without complication ([place])   . Hypertension     Past Surgical History:  Procedure Laterality Date  . BREAST SURGERY    . CHOLECYSTECTOMY      Current Medications: Current Meds  Medication Sig  . acetaminophen (TYLENOL) 500 MG tablet Take 1,000 mg by mouth every 6 (six) hours as needed for mild pain, moderate pain or headache.  Marland Kitchen amLODipine (NORVASC) 2.5 MG tablet Take 2.5 mg by mouth daily.   . Dulaglutide (TRULICITY) A999333 0000000 SOPN Inject 0.75 mg into the skin daily.  . Empagliflozin-metFORMIN HCl (SYNJARDY PO) Take 1 tablet by mouth daily.  Marland Kitchen ezetimibe (ZETIA) 10 MG tablet Take 10 mg by mouth daily.  . hydrochlorothiazide (HYDRODIURIL) 25 MG tablet Take 25 mg by mouth daily.   Marland Kitchen lisinopril (ZESTRIL) 40 MG tablet Take 40 mg by mouth daily.   . potassium chloride (KLOR-CON) 10 MEQ tablet Take 10 mEq by mouth daily.  . tamoxifen (NOLVADEX) 20 MG tablet Take 20 mg by mouth daily.  Marland Kitchen venlafaxine XR (EFFEXOR-XR) 150 MG 24 hr capsule Take 150 mg by mouth daily.      Allergies:   Penicillins   Social History   Socioeconomic History  . Marital status: Divorced    Spouse name: Not on file  . Number of children: Not on file  . Years of education: Not on file  . Highest education level: Not on file  Occupational History  . Not on file  Social Needs  . Financial resource strain: Not on file  . Food insecurity    Worry: Not on file    Inability: Not on file  . Transportation needs    Medical: Not on file    Non-medical: Not on file  Tobacco Use  . Smoking status: Never Smoker  . Smokeless tobacco: Never Used  Substance and Sexual Activity  . Alcohol use: Never    Frequency: Never  . Drug use: Never  . Sexual activity: Not on file  Lifestyle  . Physical activity    Days per week: Not on file    Minutes per session: Not on file  . Stress: Not on file  Relationships  . Social  Herbalist on phone: Not on file    Gets together: Not on file    Attends religious service: Not on file    Active member of club or organization: Not on file    Attends meetings of clubs or organizations: Not on file    Relationship status: Not on file  Other Topics Concern  . Not on file  Social History Narrative  . Not on file     Family History: The patient's family history includes Breast cancer in her mother; Diabetes Mellitus II in her mother; Lung cancer in her father.  ROS:   Review of Systems  Constitution: Positive for malaise/fatigue.  HENT: Negative.   Eyes: Negative.   Cardiovascular: Positive for dyspnea on exertion and orthopnea.  Respiratory: Positive for shortness of breath.   Endocrine: Negative.   Hematologic/Lymphatic: Negative.   Skin: Negative.   Musculoskeletal: Negative.   Gastrointestinal: Negative.   Genitourinary: Negative.   Neurological: Negative.   Psychiatric/Behavioral: Negative.   Allergic/Immunologic: Negative.    Please see the history of present illness.     All other systems reviewed and are negative.  EKGs/Labs/Other Studies Reviewed:    The following studies were reviewed today: Initial records from [place] preceding admission to [facility] with COVID-19 requested I cannot see them online.  She does not think she has had a previous EKG done during that admission  EKG:  EKG is  ordered today.  The ekg ordered today is personally reviewed and demonstrates sinus rhythm right bundle branch block.  The EKG is concerning for venous thromboembolism with S1Q3T3 and right bundle branch block  Her EKG done at [facility] 01/20/2019 showed a similar pattern right bundle branch block S1 T3  Recent Labs: 01/22/2019: Magnesium 1.7 01/25/2019: ALT 20; BUN 26; Creatinine, Ser 0.73; Potassium 3.7; Sodium 139 01/26/2019: Hemoglobin 12.2; Platelets 216  Recent Lipid Panel No results found for: CHOL, TRIG, HDL, CHOLHDL, VLDL,  LDLCALC, LDLDIRECT  Physical Exam:    VS:  BP 122/88 (BP Location: Left Arm, Patient Position: Sitting, Cuff Size: Large)   Pulse 86   Ht 5\' 6"  (1.676 m)   Wt 212 lb 12.8 oz (96.5 kg)   SpO2 97%   BMI 34.35 kg/m     Wt Readings from Last 3 Encounters:  05/01/19 212 lb 12.8 oz (96.5 kg)  01/22/19 209 lb 14.1 oz (95.2 kg)     GEN:  Well nourished, well developed in no acute distress HEENT: Normal NECK: No JVD; No carotid bruits LYMPHATICS: No lymphadenopathy CARDIAC: RRR, no murmurs, rubs, gallops RESPIRATORY:  Clear to auscultation without rales, wheezing or rhonchi  ABDOMEN: Soft, non-tender, non-distended MUSCULOSKELETAL: 3+ bilateral ankle predominant doughy edema on calcium channel blocker bilateral lower extremity edema; No deformity  SKIN: Warm and dry NEUROLOGIC:  Alert and oriented x 3 PSYCHIATRIC:  Normal affect     Signed, Shirlee More, MD  05/01/2019 9:30 AM    [place]

## 2019-05-01 NOTE — Patient Instructions (Signed)
Medication Instructions:  Your physician has recommended you make the following change in your medication:  STOP amlodipine (norvasc)   *If you need a refill on your cardiac medications before your next appointment, please call your pharmacy*  Lab Work: Your physician recommends that you return for lab work today: STAT D-dimer, ProBNP, CBC, Troponin I, CMP.   If you have labs (blood work) drawn today and your tests are completely normal, you will receive your results only by: Lauren Yu MyChart Message (if you have MyChart) OR . A paper copy in the mail If you have any lab test that is abnormal or we need to change your treatment, we will call you to review the results.  Testing/Procedures: You had an EKG today.   Your physician has requested that you have an echocardiogram. Echocardiography is a painless test that uses sound waves to create images of your heart. It provides your doctor with information about the size and shape of your heart and how well your heart's chambers and valves are working. This procedure takes approximately one hour. There are no restrictions for this procedure. This has been ordered STAT meaning it will be scheduled ASAP.   Follow-Up: At [facility], you and your health needs are our priority.  As part of our continuing mission to provide you with exceptional heart care, we have created designated Provider Care Teams.  These Care Teams include your primary Cardiologist (physician) and Advanced Practice Providers (APPs -  Physician Assistants and Nurse Practitioners) who all work together to provide you with the care you need, when you need it.  Your next appointment:   2 week(s)  The format for your next appointment:   In Person  Provider:   Laurann Montana, FNP [facility])    Echocardiogram An echocardiogram is a procedure that uses painless sound waves (ultrasound) to produce an image of the heart. Images from an echocardiogram can provide important  information about:  Signs of coronary artery disease (CAD).  Aneurysm detection. An aneurysm is a weak or damaged part of an artery wall that bulges out from the normal force of blood pumping through the body.  Heart size and shape. Changes in the size or shape of the heart can be associated with certain conditions, including heart failure, aneurysm, and CAD.  Heart muscle function.  Heart valve function.  Signs of a past heart attack.  Fluid buildup around the heart.  Thickening of the heart muscle.  A tumor or infectious growth around the heart valves. Tell a health care provider about:  Any allergies you have.  All medicines you are taking, including vitamins, herbs, eye drops, creams, and over-the-counter medicines.  Any blood disorders you have.  Any surgeries you have had.  Any medical conditions you have.  Whether you are pregnant or may be pregnant. What are the risks? Generally, this is a safe procedure. However, problems may occur, including:  Allergic reaction to dye (contrast) that may be used during the procedure. What happens before the procedure? No specific preparation is needed. You may eat and drink normally. What happens during the procedure?   An IV tube may be inserted into one of your veins.  You may receive contrast through this tube. A contrast is an injection that improves the quality of the pictures from your heart.  A gel will be applied to your chest.  A wand-like tool (transducer) will be moved over your chest. The gel will help to transmit the sound waves from  the transducer.  The sound waves will harmlessly bounce off of your heart to allow the heart images to be captured in real-time motion. The images will be recorded on a computer. The procedure may vary among health care providers and hospitals. What happens after the procedure?  You may return to your normal, everyday life, including diet, activities, and medicines, unless your  health care provider tells you not to do that. Summary  An echocardiogram is a procedure that uses painless sound waves (ultrasound) to produce an image of the heart.  Images from an echocardiogram can provide important information about the size and shape of your heart, heart muscle function, heart valve function, and fluid buildup around your heart.  You do not need to do anything to prepare before this procedure. You may eat and drink normally.  After the echocardiogram is completed, you may return to your normal, everyday life, unless your health care provider tells you not to do that. This information is not intended to replace advice given to you by your health care provider. Make sure you discuss any questions you have with your health care provider. Document Released: 05/20/2000 Document Revised: 09/13/2018 Document Reviewed: 06/25/2016 Elsevier Patient Education  2020 [employer].

## 2019-05-15 ENCOUNTER — Ambulatory Visit (INDEPENDENT_AMBULATORY_CARE_PROVIDER_SITE_OTHER): Payer: BC Managed Care – PPO | Admitting: Family

## 2019-05-15 ENCOUNTER — Ambulatory Visit (HOSPITAL_BASED_OUTPATIENT_CLINIC_OR_DEPARTMENT_OTHER)
Admission: RE | Admit: 2019-05-15 | Discharge: 2019-05-15 | Disposition: A | Discharge status: Home / Self Care | Payer: BC Managed Care – PPO | Source: Ambulatory Visit | Attending: Family | Admitting: Family

## 2019-05-15 ENCOUNTER — Other Ambulatory Visit: Payer: Self-pay

## 2019-05-15 ENCOUNTER — Encounter: Payer: Self-pay | Admitting: Family

## 2019-05-15 VITALS — BP 120/80 | HR 95 | Resp 18 | Ht 66.0 in | Wt 213.2 lb

## 2019-05-15 DIAGNOSIS — I10 Essential (primary) hypertension: Secondary | ICD-10-CM

## 2019-05-15 DIAGNOSIS — Z8619 Personal history of other infectious and parasitic diseases: Secondary | ICD-10-CM

## 2019-05-15 DIAGNOSIS — E8889 Other specified metabolic disorders: Secondary | ICD-10-CM

## 2019-05-15 DIAGNOSIS — E119 Type 2 diabetes mellitus without complications: Secondary | ICD-10-CM | POA: Diagnosis not present

## 2019-05-15 DIAGNOSIS — B342 Coronavirus infection, unspecified: Secondary | ICD-10-CM

## 2019-05-15 DIAGNOSIS — R600 Localized edema: Secondary | ICD-10-CM | POA: Diagnosis not present

## 2019-05-15 DIAGNOSIS — R0600 Dyspnea, unspecified: Secondary | ICD-10-CM | POA: Diagnosis present

## 2019-05-15 DIAGNOSIS — E785 Hyperlipidemia, unspecified: Secondary | ICD-10-CM

## 2019-05-15 DIAGNOSIS — R0609 Other forms of dyspnea: Secondary | ICD-10-CM

## 2019-05-15 DIAGNOSIS — E1165 Type 2 diabetes mellitus with hyperglycemia: Secondary | ICD-10-CM

## 2019-05-15 DIAGNOSIS — I4510 Unspecified right bundle-branch block: Secondary | ICD-10-CM

## 2019-05-15 NOTE — Progress Notes (Signed)
Office Visit    Patient Name: Lauren Yu Date of Encounter: 05/15/2019  Primary Care Provider:  Suzan Garibaldi, [place] Primary Cardiologist:  Shirlee More, MD Electrophysiologist:  None   Chief Complaint    Lauren Yu is a 53 y.o. female with a hx of HTN, LE edema, DOE, DM2, COVID19, presents today for follow up after echocardiogram.   Past Medical History    Past Medical History:  Diagnosis Date  . Cancer ([place])   . Diabetes mellitus without complication ([place])   . Hypertension    Past Surgical History:  Procedure Laterality Date  . BREAST SURGERY    . CHOLECYSTECTOMY      Allergies  Allergies  Allergen Reactions  . Penicillins Other (See Comments)    Unknown Did it involve swelling of the face/tongue/throat, SOB, or low BP? Unknown Did it involve sudden or severe rash/hives, skin peeling, or any reaction on the inside of your mouth or nose? Unknown Did you need to seek medical attention at a hospital or doctor's office? Unknown  When did it last happen?unknown  If all above answers are "NO", may proceed with cephalosporin use.      History of Present Illness    Lauren Yu is a 53 y.o. female with a hx of HTN, LE edema, DOE, DM2, RBBB, COVID19, breast cancer last seen 05/01/19 by Dr. Bettina Gavia. She was hospitalized for COVID19 in August with hypoxic respiratory failure and COVID19 viral pneumonia. Since that time she has had consistent DOE. She was referred to Dr. Bettina Gavia for DOE and LE edema. CXR 03/18/19 at [facility] that showed persistent peripheral opacities but improved from previous imaging felt to represent scarring. 05/01/19 she had normal ProBNP, negative troponin, Hb 14.2, d-dimer 0.25, K 3.4.   At her last office visit Amlodipine was discontinued as it can be contributory to LE edema. She had an echocardiogram 05/01/19 with LVEF 65-70% and impaired LV relaxation. No significant valvular abnormalities.    She reports her LE edema is mildly  improved. Continues to notice orthopnea and sleeps on extra pillows. She has notable varicose veins and some venous stasis color changes. Discussed venous insufficiency. She works long shifts on her feet. Recommended elevating her lower extremities when sitting or wearing compression stockings. She endorses eating a heart healthy diet, does not add salt to food even when cooking. She has not needed her loop diuretic for edema since her last office visit.   Her DOE is stable. Discussed that her echo shows strong heart pumping function. Concern that her DOE is residual effect of COVID 19 infection. She was discharged from the hospital on oxygen and prednisone, but was weaned off of both. She has not been seen by pulmonology and was recommended to follow up with her PCP. We discussed getting a CT to evaluate her lungs after COVID and she was agreeable. Discussed the often residual effects of COVID19.  She lives with her daughter and they have been walking 15 minutes per day for exercise. She was encouraged to continue.   EKGs/Labs/Other Studies Reviewed:   The following studies were reviewed today:  Echo 05/01/19 at [place] 1. Overall LV systolic functio normal with EF 65-70% 2. The diastolic filling pattern indicated impaired relaxation. 3. The left atrium is normal in size. 4. Mild tricuspid regurgitation is present. 5. The RV systolic pressure as measured by Doppler is 33 mmHg.   EKG:  No EKG today.   Recent Labs: 01/22/2019: Magnesium 1.7 05/01/2019:  ALT 21; BUN 14; Creatinine, Ser 0.76; Hemoglobin 14.2; NT-Pro BNP 153; Platelets 149; Potassium 3.4; Sodium 139  Recent Lipid Panel No results found for: CHOL, TRIG, HDL, CHOLHDL, VLDL, LDLCALC, LDLDIRECT  Home Medications   Current Meds  Medication Sig  . acetaminophen (TYLENOL) 500 MG tablet Take 1,000 mg by mouth every 6 (six) hours as needed for mild pain, moderate pain or headache.  . Dulaglutide (TRULICITY) A999333 [id number]  SOPN Inject 0.75 mg into the skin once a week.   . Empagliflozin-metFORMIN HCl (SYNJARDY PO) Take 1 tablet by mouth daily.  Marland Kitchen ezetimibe (ZETIA) 10 MG tablet Take 10 mg by mouth daily.  . hydrochlorothiazide (HYDRODIURIL) 25 MG tablet Take 25 mg by mouth daily.   Marland Kitchen lisinopril (ZESTRIL) 40 MG tablet Take 40 mg by mouth daily.   . tamoxifen (NOLVADEX) 20 MG tablet Take 20 mg by mouth daily.  Marland Kitchen venlafaxine XR (EFFEXOR-XR) 150 MG 24 hr capsule Take 150 mg by mouth daily.       Review of Systems    Review of Systems  Constitution: Positive for malaise/fatigue. Negative for chills and fever.  Cardiovascular: Positive for dyspnea on exertion and orthopnea. Negative for chest pain, leg swelling, near-syncope and palpitations.  Respiratory: Positive for shortness of breath. Negative for cough and wheezing.   Gastrointestinal: Negative for nausea and vomiting.  Neurological: Negative for dizziness, light-headedness and weakness.   All other systems reviewed and are otherwise negative except as noted above.  Physical Exam    VS:  BP 120/80 (BP Location: Left Arm, Patient Position: Sitting, Cuff Size: Normal)   Pulse 95   Resp 18   Ht 5\' 6"  (1.676 m)   Wt 213 lb 4 oz (96.7 kg)   SpO2 98%   BMI 34.42 kg/m  , BMI Body mass index is 34.42 kg/m. GEN: Well nourished, overweight, well developed, in no acute distress. HEENT: normal. Neck: Supple, no JVD, carotid bruits, or masses. Cardiac: RRR, no murmurs, rubs, or gallops. No clubbing, cyanosis, edema.  Radials/DP/PT 2+ and equal bilaterally.  Respiratory:  Respirations regular and unlabored. Right lower lobe diminished. LUL, LLL, RUL clear to auscultation. GI: Soft, nontender, nondistended, BS + x 4. MS: No deformity or atrophy. Skin: Warm and dry, no rash. Noted varicose veins bilaterally and venous stasis changes.  Neuro:  Strength and sensation are intact. Psych: Normal affect.  Assessment & Plan    1. DOE - Echo 05/01/19 with LVEF  65-70%, impaired LV relaxation, mild TR. 05/01/19 d-dimer negative, troponin negative, Hb normal, ProBNP normal. No evidence of anemia nor fluid volume overload. Her RLL breath sounds were diminished on exam today.  Plan for CT chest wo contrast today as likely continued effects of previous COVID19 infection. Anticipate possible referral to pulmonology or discussion of maintenance inhaler with her PCP. She has her incentive spirometer from hospitalization and was encouraged to use.   2. LE edema - Improved since stopping Amlodipine. Takes Lasix 10mg  PRN, has not needed since stopping Amlodipine. We discussed that edematous effect of Amlodipine can take 4-6 weeks to improve. Likely multifactorial etiology with noted venous insufficiency, CCB, standing in dependent position most of day, and impaired LV relaxation on echo 05/01/2019.   Continue low sodium diet.   Recommend compression stockings.   Elevate lower extremities when sitting.  3. Hx coronavirus infection - Hospitalized 01/2019. Discharged on O2 and prednisone which were weaned. Continued DOE and fatigue. Plan for CT chest as above. May require referral to pulmonology.  4. HTN - BP well controlled. Continue present antihypertensive regimen.   5. RBBB - Known hx. No near syncope nor syncope. Anticipate monitoring with annual EKG.   6. DM2 - Follows with her PCP. Appreciate inclusion of SGLT2i for cardioprotection.  7. DLD - Follows with her PCP. Continue Zetia.   Disposition: Follow up in 6 week(s) with Dr. Earney Navy, NP 05/15/2019, 3:18 PM

## 2019-05-15 NOTE — Patient Instructions (Addendum)
Medication Instructions:  No medication changes today.  *If you need a refill on your cardiac medications before your next appointment, please call your pharmacy*  Lab Work: No lab work today.  If you have labs (blood work) drawn today and your tests are completely normal, you will receive your results only by: Marland Kitchen MyChart Message (if you have MyChart) OR . A paper copy in the mail If you have any lab test that is abnormal or we need to change your treatment, we will call you to review the results.  Testing/Procedures: Your provider has recommended a CT scan of your chest with no contrast to look at your lungs after your COVID infection. Non-Cardiac CT scanning, (CAT scanning), is a noninvasive, special x-ray that produces cross-sectional images of the body using x-rays and a computer. CT scans help physicians diagnose and treat medical conditions. For some CT exams, a contrast material is used to enhance visibility in the area of the body being studied. CT scans provide greater clarity and reveal more details than regular x-ray exams.  Your echocardiogram showed normal pumping function. It showed some impaired relaxation of the heart which is common in those with hypertension (high blood pressure). It may contribute to some of your swelling. Your heart valves showed no significant changes which is good.  Follow-Up: At [facility], you and your health needs are our priority.  As part of our continuing mission to provide you with exceptional heart care, we have created designated Provider Care Teams.  These Care Teams include your primary Cardiologist (physician) and Advanced Practice Providers (APPs -  Physician Assistants and Nurse Practitioners) who all work together to provide you with the care you need, when you need it.  Your next appointment:   6 week(s)  The format for your next appointment:   In Person  Provider:   Shirlee More, MD  Other Instructions   Elevate lower  extremities when sitting.  Continue to avoid salt.  Try to wear compression stockings or diabetic socks while at work.    Chronic Venous Insufficiency Chronic venous insufficiency is a condition where the leg veins cannot effectively pump blood from the legs to the heart. This happens when the vein walls are either stretched, weakened, or damaged, or when the valves inside the vein are damaged. With the right treatment, you should be able to continue with an active life. This condition is also called venous stasis. What are the causes? Common causes of this condition include:  High blood pressure inside the veins (venous hypertension).  Sitting or standing too long, causing increased blood pressure in the leg veins.  A blood clot that blocks blood flow in a vein (deep vein thrombosis, DVT).  Inflammation of a vein (phlebitis) that causes a blood clot to form.  Tumors in the pelvis that cause blood to back up. What increases the risk? The following factors may make you more likely to develop this condition:  Having a family history of this condition.  Obesity.  Pregnancy.  Living without enough regular physical activity or exercise (sedentary lifestyle).  Smoking.  Having a job that requires long periods of standing or sitting in one place.  Being a certain age. Women in their 40s and 50s and men in their 90s are more likely to develop this condition. What are the signs or symptoms? Symptoms of this condition include:  Veins that are enlarged, bulging, or twisted (varicose veins).  Skin breakdown or ulcers.  Reddened skin or dark discoloration  of skin on the leg between the knee and ankle.  Brown, smooth, tight, and painful skin just above the ankle, usually on the inside of the leg (lipodermatosclerosis).  Swelling of the legs. How is this diagnosed? This condition may be diagnosed based on:  Your medical history.  A physical exam.  Tests, such as: ? A  procedure that creates an image of a blood vessel and nearby organs and provides information about blood flow through the blood vessel (duplex ultrasound). ? A procedure that tests blood flow (plethysmography). ? A procedure that looks at the veins using X-ray and dye (venogram). How is this treated? The goals of treatment are to help you return to an active life and to minimize pain or disability. Treatment depends on the severity of your condition, and it may include:  Wearing compression stockings. These can help relieve symptoms and help prevent your condition from getting worse. However, they do not cure the condition.  Sclerotherapy. This procedure involves an injection of a solution that shrinks damaged veins.  Surgery. This may involve: ? Removing a diseased vein (vein stripping). ? Cutting off blood flow through the vein (laser ablation surgery). ? Repairing or reconstructing a valve within the affected vein.  Follow these instructions at home:  Wear compression stockings (or diabetic socks) as told by your health care provider. These stockings help to prevent blood clots and reduce swelling in your legs.  Take over-the-counter and prescription medicines only as told by your health care provider.  Elevate your lower extremities when sitting.   Stay active by exercising, walking, or doing different activities. Ask your health care provider what activities are safe for you and how much exercise you need.  Drink enough fluid to keep your urine pale yellow.  Do not use any products that contain nicotine or tobacco, such as cigarettes, e-cigarettes, and chewing tobacco. If you need help quitting, ask your health care provider.  Keep all follow-up visits as told by your health care provider. This is important. Contact a health care provider if you:  Have redness, swelling, or more pain in the affected area.  See a red streak or line that goes up or down from the affected  area.  Have skin breakdown or skin loss in the affected area, even if the breakdown is small.  Get an injury in the affected area. Get help right away if:  You get an injury and an open wound in the affected area.  You have: ? Severe pain that does not get better with medicine. ? Sudden numbness or weakness in the foot or ankle below the affected area. ? Trouble moving your foot or ankle. ? A fever. ? Worse or persistent symptoms. ? Chest pain. ? Shortness of breath. Summary  Chronic venous insufficiency is a condition where the leg veins cannot effectively pump blood from the legs to the heart.  Chronic venous insufficiency occurs when the vein walls become stretched, weakened, or damaged, or when valves within the vein are damaged.  Treatment depends on how severe your condition is. It often involves wearing compression stockings and may involve having a procedure.  Make sure you stay active by exercising, walking, or doing different activities. Ask your health care provider what activities are safe for you and how much exercise you need. This information is not intended to replace advice given to you by your health care provider. Make sure you discuss any questions you have with your health care provider. Document Released: 09/26/2006  Document Revised: 02/13/2018 Document Reviewed: 02/13/2018 Elsevier Patient Education  [employer].

## 2019-05-16 NOTE — Addendum Note (Signed)
Addended by: Loel Dubonnet on: 05/16/2019 04:33 PM   Modules accepted: Orders

## 2019-06-12 ENCOUNTER — Ambulatory Visit: Payer: BC Managed Care – PPO | Admitting: Cardiology

## 2019-06-28 ENCOUNTER — Ambulatory Visit: Payer: BC Managed Care – PPO | Admitting: Cardiology
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
## 2019-07-10 ENCOUNTER — Encounter: Payer: Self-pay | Admitting: Cardiology

## 2019-07-10 ENCOUNTER — Other Ambulatory Visit: Payer: Self-pay

## 2019-07-10 ENCOUNTER — Ambulatory Visit (INDEPENDENT_AMBULATORY_CARE_PROVIDER_SITE_OTHER): Payer: BC Managed Care – PPO | Admitting: Cardiology

## 2019-07-10 VITALS — BP 136/78 | HR 84 | Ht 66.0 in | Wt 209.0 lb

## 2019-07-10 DIAGNOSIS — R600 Localized edema: Secondary | ICD-10-CM | POA: Diagnosis not present

## 2019-07-10 DIAGNOSIS — I4510 Unspecified right bundle-branch block: Secondary | ICD-10-CM

## 2019-07-10 DIAGNOSIS — I10 Essential (primary) hypertension: Secondary | ICD-10-CM

## 2019-07-10 DIAGNOSIS — R0602 Shortness of breath: Secondary | ICD-10-CM

## 2019-07-10 NOTE — Patient Instructions (Signed)
Medication Instructions:  Your physician recommends that you continue on your current medications as directed. Please refer to the Current Medication list given to you today.  *If you need a refill on your cardiac medications before your next appointment, please call your pharmacy*  Lab Work: NONE If you have labs (blood work) drawn today and your tests are completely normal, you will receive your results only by: Marland Kitchen MyChart Message (if you have MyChart) OR . A paper copy in the mail If you have any lab test that is abnormal or we need to change your treatment, we will call you to review the results.  Testing/Procedures: NONE  Follow-Up: At [facility], you and your health needs are our priority.  As part of our continuing mission to provide you with exceptional heart care, we have created designated Provider Care Teams.  These Care Teams include your primary Cardiologist (physician) and Advanced Practice Providers (APPs -  Physician Assistants and Nurse Practitioners) who all work together to provide you with the care you need, when you need it.  Your next appointment:   AS needed in the future  The format for your next appointment:   In Person  Provider:   Shirlee More, MD

## 2020-03-24 DIAGNOSIS — C50511 Malignant neoplasm of lower-outer quadrant of right female breast: Secondary | ICD-10-CM

## 2020-11-10 DIAGNOSIS — R0781 Pleurodynia: Secondary | ICD-10-CM | POA: Insufficient documentation

## 2020-11-17 ENCOUNTER — Encounter: Payer: Self-pay | Admitting: Sports Medicine

## 2020-11-17 ENCOUNTER — Ambulatory Visit: Payer: BC Managed Care – PPO | Admitting: Sports Medicine

## 2020-11-17 ENCOUNTER — Other Ambulatory Visit: Payer: Self-pay

## 2020-11-17 ENCOUNTER — Ambulatory Visit (INDEPENDENT_AMBULATORY_CARE_PROVIDER_SITE_OTHER): Payer: BC Managed Care – PPO

## 2020-11-17 DIAGNOSIS — M21612 Bunion of left foot: Secondary | ICD-10-CM | POA: Diagnosis not present

## 2020-11-17 DIAGNOSIS — M79672 Pain in left foot: Secondary | ICD-10-CM

## 2020-11-17 DIAGNOSIS — M2042 Other hammer toe(s) (acquired), left foot: Secondary | ICD-10-CM

## 2020-11-17 DIAGNOSIS — E1141 Type 2 diabetes mellitus with diabetic mononeuropathy: Secondary | ICD-10-CM | POA: Diagnosis not present

## 2020-11-17 DIAGNOSIS — E1165 Type 2 diabetes mellitus with hyperglycemia: Secondary | ICD-10-CM | POA: Diagnosis not present

## 2020-11-17 NOTE — Progress Notes (Signed)
Subjective: Lauren Yu is a 55 y.o. female patient who presents to office for evaluation of left bunion pain reports there is a burning sensation at this toe as well as her second toe has noticed that it has started to curve more and has gotten more numb and tingling reports that pain is 8 out of 10 with certain shoes with a little bit of redness.  Patient denies any other pedal complaints at this time.    Patient is diabetic with blood sugar this morning at 200 Last A1c 8.3  Patient Active Problem List   Diagnosis Date Noted   Rib pain on right side 11/10/2020   Shortness of breath 05/01/2019   Leg swelling 05/01/2019   Acute respiratory failure with hypoxia ([place]) 01/21/2019   Pneumonia due to COVID-19 virus 01/21/2019   Controlled type 2 diabetes mellitus with hyperglycemia ([place]) 01/21/2019   Essential hypertension 01/21/2019   Gastroesophageal reflux disease without esophagitis 10/22/2018   Hematochezia 10/22/2018   Osteoarthritis of left knee 07/17/2018   Pain in left knee 06/19/2018   History of breast cancer in female 01/01/2018   Screening breast examination 01/01/2018   Chest pain in adult 02/15/2016   Palpitation 02/15/2016   BMI 36.0-36.9,adult 01/04/2016   Estrogen receptor positive neoplasm 01/04/2016   Malignant neoplasm of central portion of right female breast ([place]) 01/04/2016    Current Outpatient Medications on File Prior to Visit  Medication Sig Dispense Refill   Dulaglutide (TRULICITY) 1.61 WR/6.0AV SOPN Inject 0.75 mg into the skin once a week.      Empagliflozin-metFORMIN HCl (SYNJARDY PO) Take 1 tablet by mouth daily.     hydrochlorothiazide (HYDRODIURIL) 25 MG tablet Take 25 mg by mouth daily.      hydrOXYzine (VISTARIL) 50 MG capsule Take by mouth.     lisinopril (ZESTRIL) 40 MG tablet Take 40 mg by mouth daily.      venlafaxine XR (EFFEXOR-XR) 150 MG 24 hr capsule Take 150 mg by mouth daily.      VITAMIN D PO Take by mouth.     No current  facility-administered medications on file prior to visit.    Allergies  Allergen Reactions   Penicillins Other (See Comments)    Unknown Did it involve swelling of the face/tongue/throat, SOB, or low BP? Unknown Did it involve sudden or severe rash/hives, skin peeling, or any reaction on the inside of your mouth or nose? Unknown Did you need to seek medical attention at a hospital or doctor's office? Unknown  When did it last happen?    unknown    If all above answers are "NO", may proceed with cephalosporin use.      Objective:  General: Alert and oriented x3 in no acute distress  Dermatology: No open lesions bilateral lower extremities, no webspace macerations, no ecchymosis bilateral, all nails x 10 are well manicured.  Vascular: Dorsalis Pedis and Posterior Tibial pedal pulses 1/4, Capillary Fill Time 3 seconds, (+) pedal hair growth bilateral, no edema bilateral lower extremities, Temperature gradient within normal limits.  Neurology: Gross sensation intact via light touch bilateral, subjective numbness and burning over the bunion of the left big toe joint.  Musculoskeletal: Mild tenderness with palpation left bunion deformity, there is limitation but no crepitus with range of motion, deformity trackbound, there is mild 1st ray hypermobility noted on the left.  There is significant dislocated left second hammertoe for many noted with crossover, midtarsal, subtalar joint, and ankle joint range of motion is within normal  limits. On weightbearing exam, there is decreased 1st MTPJ rom left with functional limitus noted, there is medial arch collapse left greater than right on weightbearing, rearfoot Valgus, forefoot abduction with HAV deformity supported on ground with second toe crossover deformity noted.   Gait: Antalgic with shoes rubbing bunion  Xrays  Left Foot    Impression: Intermetatarsal angle above normal limits supportive of bunion deformity, there is significant deviation  of hallux, and significant dislocation of the left second toe with hammertoe deformity noted.  Metatarsal noted.  There is midtarsal breech supportive of pes planus deformity.      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 All patient's questions answered. -Advised patient that she is not a candidate for any surgery at this time until she can get her A1c down to 7 or below -Meanwhile recommend conservative care and advised patient to try over-the-counter topical Biofreeze or Voltaren as needed for pain -Advised patient to try toe spacer for bunion cushion -Recommend continue with good supportive shoes that do not rub toes -Patient to return to office as needed or sooner if condition worsens.  Advised patient if symptoms worsen meanwhile can consider steroid injection versus anti-inflammatories by mouth however advised patient that this still may only provide temporary relief.  Patient elects at this time to try above and will return to office if symptoms fail to continue to improve.  Landis Martins, DPM

## 2020-11-18 ENCOUNTER — Other Ambulatory Visit: Payer: Self-pay | Admitting: Sports Medicine

## 2020-11-18 DIAGNOSIS — M21612 Bunion of left foot: Secondary | ICD-10-CM

## 2020-11-18 DIAGNOSIS — M2042 Other hammer toe(s) (acquired), left foot: Secondary | ICD-10-CM

## 2021-01-07 ENCOUNTER — Emergency Department (HOSPITAL_COMMUNITY)
Admission: EM | Admit: 2021-01-07 | Discharge: 2021-01-07 | Disposition: A | Discharge status: Home / Self Care | Payer: BC Managed Care – PPO | Attending: Emergency Medicine | Admitting: Emergency Medicine

## 2021-01-07 ENCOUNTER — Encounter (HOSPITAL_COMMUNITY): Payer: Self-pay | Admitting: Emergency Medicine

## 2021-01-07 ENCOUNTER — Emergency Department (HOSPITAL_COMMUNITY): Payer: BC Managed Care – PPO

## 2021-01-07 DIAGNOSIS — S8782XA Crushing injury of left lower leg, initial encounter: Secondary | ICD-10-CM

## 2021-01-07 DIAGNOSIS — T1490XA Injury, unspecified, initial encounter: Secondary | ICD-10-CM

## 2021-01-07 DIAGNOSIS — S8992XA Unspecified injury of left lower leg, initial encounter: Secondary | ICD-10-CM | POA: Diagnosis present

## 2021-01-07 DIAGNOSIS — S80812A Abrasion, left lower leg, initial encounter: Secondary | ICD-10-CM | POA: Insufficient documentation

## 2021-01-07 LAB — CBC WITH DIFFERENTIAL/PLATELET
Abs Immature Granulocytes: 0.02 10*3/uL (ref 0.00–0.07)
Basophils Absolute: 0.1 10*3/uL (ref 0.0–0.1)
Basophils Relative: 1 %
Eosinophils Absolute: 0.2 10*3/uL (ref 0.0–0.5)
Eosinophils Relative: 3 %
HCT: 39.3 % (ref 36.0–46.0)
Hemoglobin: 13.6 g/dL (ref 12.0–15.0)
Immature Granulocytes: 0 %
Lymphocytes Relative: 53 %
Lymphs Abs: 3.4 10*3/uL (ref 0.7–4.0)
MCH: 30.6 pg (ref 26.0–34.0)
MCHC: 34.6 g/dL (ref 30.0–36.0)
MCV: 88.3 fL (ref 80.0–100.0)
Monocytes Absolute: 0.5 10*3/uL (ref 0.1–1.0)
Monocytes Relative: 8 %
Neutro Abs: 2.3 10*3/uL (ref 1.7–7.7)
Neutrophils Relative %: 35 %
Platelets: 146 10*3/uL — ABNORMAL LOW (ref 150–400)
RBC: 4.45 MIL/uL (ref 3.87–5.11)
RDW: 12.4 % (ref 11.5–15.5)
WBC: 6.5 10*3/uL (ref 4.0–10.5)
nRBC: 0 % (ref 0.0–0.2)

## 2021-01-07 LAB — BASIC METABOLIC PANEL
Anion gap: 12 (ref 5–15)
BUN: 16 mg/dL (ref 6–20)
CO2: 25 mmol/L (ref 22–32)
Calcium: 9.1 mg/dL (ref 8.9–10.3)
Chloride: 100 mmol/L (ref 98–111)
Creatinine, Ser: 1.06 mg/dL — ABNORMAL HIGH (ref 0.44–1.00)
GFR, Estimated: >60 mL/min (ref >60)
Glucose, Bld: 224 mg/dL — ABNORMAL HIGH (ref 70–99)
Potassium: 3 mmol/L — ABNORMAL LOW (ref 3.5–5.1)
Sodium: 137 mmol/L (ref 135–145)

## 2021-01-07 LAB — CK: Total CK: 95 U/L (ref 38–234)

## 2021-01-07 MED ORDER — ONDANSETRON HCL 4 MG/2ML IJ SOLN
4.0000 mg | Freq: Once | INTRAMUSCULAR | Status: AC
  Administered 2021-01-07: 4 mg via INTRAVENOUS
  Filled 2021-01-07: qty 2

## 2021-01-07 MED ORDER — MORPHINE SULFATE 15 MG PO TABS: 7.5000 mg | ORAL_TABLET | ORAL | 0 refills | Status: DC | PRN

## 2021-01-07 MED ORDER — MAGNESIUM OXIDE -MG SUPPLEMENT 400 (240 MG) MG PO TABS
800.0000 mg | ORAL_TABLET | Freq: Once | ORAL | Status: AC
  Administered 2021-01-07: 800 mg via ORAL
  Filled 2021-01-07: qty 2

## 2021-01-07 MED ORDER — MORPHINE SULFATE 15 MG PO TABS: 7.5000 mg | ORAL_TABLET | ORAL | 0 refills | Status: AC | PRN

## 2021-01-07 MED ORDER — MORPHINE SULFATE (PF) 4 MG/ML IV SOLN
4.0000 mg | Freq: Once | INTRAVENOUS | Status: AC
  Administered 2021-01-07: 4 mg via INTRAVENOUS
  Filled 2021-01-07: qty 1

## 2021-01-07 MED ORDER — POTASSIUM CHLORIDE CRYS ER 20 MEQ PO TBCR
40.0000 meq | EXTENDED_RELEASE_TABLET | Freq: Two times a day (BID) | ORAL | Status: DC
  Administered 2021-01-07: 40 meq via ORAL
  Filled 2021-01-07: qty 2

## 2021-01-07 NOTE — Discharge Instructions (Addendum)
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
## 2021-01-08 ENCOUNTER — Encounter: Payer: Self-pay | Admitting: Sports Medicine

## 2021-03-16 ENCOUNTER — Telehealth: Payer: Self-pay | Admitting: Oncology

## 2021-03-16 NOTE — Telephone Encounter (Signed)
Pulled patient's Appt from [facility].  Patient scheduled for 10/12 at 10:45 am - Patient aware of Appt

## 2021-03-17 ENCOUNTER — Inpatient Hospital Stay: Payer: BC Managed Care – PPO | Admitting: Oncology

## 2021-03-18 ENCOUNTER — Encounter: Payer: Self-pay | Admitting: Hematology and Oncology

## 2021-03-18 ENCOUNTER — Inpatient Hospital Stay: Payer: BC Managed Care – PPO | Attending: Oncology | Admitting: Hematology and Oncology

## 2021-03-18 DIAGNOSIS — C50111 Malignant neoplasm of central portion of right female breast: Secondary | ICD-10-CM
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
## 2021-03-18 NOTE — Assessment & Plan Note (Addendum)
Benign exam today. Mammogram from July of this year is negative. She continues to follow with Dr. Noberto Retort. She completed 10 years of daily tamoxifen.   We will see her back in clinic in one year. She completed 10 years of daily tamoxifen.

## 2021-05-12 ENCOUNTER — Ambulatory Visit: Payer: BC Managed Care – PPO | Admitting: Sports Medicine

## 2021-07-20 ENCOUNTER — Telehealth: Payer: Self-pay | Admitting: *Deleted

## 2021-07-20 NOTE — Telephone Encounter (Signed)
RN called pt and instructed that Dr Bobby Rumpf stated no further testing was necessary for the basal cell skin cancer removed on left arm on 07/12/21 at [facility].  Pt verbalized understanding.

## 2021-07-20 NOTE — Telephone Encounter (Signed)
Pt called and stated she had a skin cancer removed on 07/12/21. Pt wanted to notify Dr Bobby Rumpf and was asking if she need any further evaluation or testing.  RN instructed she would check with MD and someone would call her back.  Pt verbalized understanding.
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
## 2022-03-18 ENCOUNTER — Ambulatory Visit: Payer: BC Managed Care – PPO | Admitting: Oncology

## 2022-03-20 NOTE — Progress Notes (Signed)
[facility]  [address] [place],  [place]  [address] [phone]  Clinic Day:  03/21/2022  Referring physician: Suzan Garibaldi, FNP   HISTORY OF PRESENT ILLNESS:  The patient is a 56 y.o. female with a history of right-sided stage IIIA (T3 N1 M0) hormone positive breast cancer, status post a right mastectomy in January 2011. This was followed by adjuvant chemotherapy and radiation.  As she presumptively still had ovarian function at the time of diagnosis, she took tamoxifen for 10 years for her adjuvant endocrine therapy.   She comes in today for routine follow up.  Since her last visit, the patient has been doing fairly well.  From a breast cancer perspective, she denies having any particular changes with her left breast or right chest wall that concern her for disease recurrence.  Of note, her annual mammogram in August 2023 continued to show no evidence of disease recurrence.  PHYSICAL EXAM:  Blood pressure 135/87, pulse 83, temperature 98.7 F (37.1 C), resp. rate 16, height '5\' 6"'$  (1.676 m), weight 195 lb (88.5 kg), SpO2 96 %. Wt Readings from Last 3 Encounters:  03/21/22 195 lb (88.5 kg)  03/18/21 197 lb 12.8 oz (89.7 kg)  07/10/19 209 lb (94.8 kg)   Body mass index is 31.47 kg/m. Performance status (ECOG): 0 - Asymptomatic Physical Exam Constitutional:      Appearance: Normal appearance.  HENT:     Mouth/Throat:     Pharynx: Oropharynx is clear. No oropharyngeal exudate.  Cardiovascular:     Rate and Rhythm: Normal rate and regular rhythm.     Heart sounds: No murmur heard.    No friction rub. No gallop.  Pulmonary:     Breath sounds: Normal breath sounds.  Chest:  Breasts:    Right: Absent. No swelling, bleeding, inverted nipple, mass, nipple discharge or skin change.     Left: No swelling, bleeding, inverted nipple, mass, nipple discharge or skin change.  Abdominal:     General: Bowel sounds are normal. There is no distension.      Palpations: Abdomen is soft. There is no mass.     Tenderness: There is no abdominal tenderness.  Musculoskeletal:        General: No tenderness.     Cervical back: Normal range of motion and neck supple.     Right lower leg: No edema.     Left lower leg: No edema.  Lymphadenopathy:     Cervical: No cervical adenopathy.     Right cervical: No superficial, deep or posterior cervical adenopathy.    Left cervical: No superficial, deep or posterior cervical adenopathy.     Upper Body:     Right upper body: No supraclavicular or axillary adenopathy.     Left upper body: No supraclavicular or axillary adenopathy.     Lower Body: No right inguinal adenopathy. No left inguinal adenopathy.  Skin:    Coloration: Skin is not jaundiced.     Findings: No lesion or rash.  Neurological:     General: No focal deficit present.     Mental Status: She is alert and oriented to person, place, and time. Mental status is at baseline.  Psychiatric:        Mood and Affect: Mood normal.        Behavior: Behavior normal.        Thought Content: Thought content normal.        Judgment: Judgment normal.    ASSESSMENT &  PLAN:  Assessment/Plan:  A 56 y.o. female with right-sided stage IIIA (T3 N1 M0) hormone positive breast cancer, status post a right mastectomy in January 2011.  Based upon her clinical breast exam today and recent mammogram, the patient remains disease-free.  Clinically, the patient continues to do very well.  Per her request today, she wishes to have a bone density study done before the end of this calendar year, for which I will arrange.  Otherwise, I will see her back in 1 year for a repeat clinical breast exam. The patient understands all the plans discussed today and is in agreement with them.      Shatasha Lambing Macarthur Critchley, MD

## 2022-03-21 ENCOUNTER — Other Ambulatory Visit: Payer: Self-pay | Admitting: Oncology

## 2022-03-21 ENCOUNTER — Inpatient Hospital Stay: Payer: BC Managed Care – PPO | Attending: Oncology | Admitting: Oncology

## 2022-03-21 VITALS — BP 135/87 | HR 83 | Temp 98.7°F | Resp 16 | Ht 66.0 in | Wt 195.0 lb

## 2022-03-21 DIAGNOSIS — Z170 Estrogen receptor positive status [ER+]: Secondary | ICD-10-CM

## 2022-03-21 DIAGNOSIS — C50111 Malignant neoplasm of central portion of right female breast: Secondary | ICD-10-CM

## 2022-03-21 NOTE — Progress Notes (Signed)
PATIENT HAS RECENTLY BEEN SEEN BY [facility] AND DR. MICHAEL LININGER FOR MULTIPLE BASAL CELL CARCINOMAS.
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
## 2023-03-22 ENCOUNTER — Other Ambulatory Visit: Payer: Self-pay | Admitting: Oncology

## 2023-03-22 ENCOUNTER — Inpatient Hospital Stay: Payer: Medicaid Other | Attending: Oncology | Admitting: Oncology

## 2023-03-22 VITALS — BP 172/86 | HR 88 | Temp 99.2°F | Resp 14 | Ht 66.0 in | Wt 200.0 lb

## 2023-03-22 DIAGNOSIS — Z170 Estrogen receptor positive status [ER+]: Secondary | ICD-10-CM

## 2023-03-22 DIAGNOSIS — C50111 Malignant neoplasm of central portion of right female breast: Secondary | ICD-10-CM

## 2024-03-21 NOTE — Progress Notes (Unsigned)
 [facility]  [address] [place],  [place]  [address] [phone]  Clinic Day:  03/21/2024  Referring physician: Jackolyn Darice BROCKS, FNP   HISTORY OF PRESENT ILLNESS:  The patient is a 58 y.o. female with a history of right-sided stage IIIA (T3 N1 M0) hormone positive breast cancer, status post a right mastectomy in January 2011. This was followed by adjuvant chemotherapy and radiation.  As she presumptively still had ovarian function at the time of diagnosis, she took tamoxifen for 10 years for her adjuvant endocrine therapy.   She comes in today for routine follow up.  Since her last visit, the patient has been doing fairly well.  From a breast cancer perspective, she denies having any particular changes with her left breast or right chest wall that concern her for disease recurrence.   Of note, her annual mammogram has been scheduled for December 2025.  PHYSICAL EXAM:  There were no vitals taken for this visit. Wt Readings from Last 3 Encounters:  03/22/23 200 lb (90.7 kg)  03/21/22 195 lb (88.5 kg)  03/18/21 197 lb 12.8 oz (89.7 kg)   There is no height or weight on file to calculate BMI. Performance status (ECOG): 0 - Asymptomatic Physical Exam Constitutional:      Appearance: Normal appearance.     Comments: She has a left ankle cast on  HENT:     Mouth/Throat:     Pharynx: Oropharynx is clear. No oropharyngeal exudate.  Cardiovascular:     Rate and Rhythm: Normal rate and regular rhythm.     Heart sounds: No murmur heard.    No friction rub. No gallop.  Pulmonary:     Breath sounds: Normal breath sounds.  Chest:  Breasts:    Right: Absent. No swelling, bleeding, inverted nipple, mass, nipple discharge or skin change.     Left: No swelling, bleeding, inverted nipple, mass, nipple discharge or skin change.  Abdominal:     General: Bowel sounds are normal. There is no distension.     Palpations: Abdomen is soft. There is no mass.      Tenderness: There is no abdominal tenderness.  Musculoskeletal:        General: No tenderness.     Cervical back: Normal range of motion and neck supple.     Right lower leg: No edema.     Left lower leg: No edema.  Lymphadenopathy:     Cervical: No cervical adenopathy.     Right cervical: No superficial, deep or posterior cervical adenopathy.    Left cervical: No superficial, deep or posterior cervical adenopathy.     Upper Body:     Right upper body: No supraclavicular or axillary adenopathy.     Left upper body: No supraclavicular or axillary adenopathy.     Lower Body: No right inguinal adenopathy. No left inguinal adenopathy.  Skin:    Coloration: Skin is not jaundiced.     Findings: No lesion or rash.  Neurological:     General: No focal deficit present.     Mental Status: She is alert and oriented to person, place, and time. Mental status is at baseline.  Psychiatric:        Mood and Affect: Mood normal.        Behavior: Behavior normal.        Thought Content: Thought content normal.        Judgment: Judgment normal.    ASSESSMENT & PLAN:  Assessment/Plan:  A 59 y.o. female with right-sided stage IIIA (T3 N1 M0) hormone positive breast cancer, status post a right mastectomy in January 2011.  Based upon her clinical breast exam today and recent mammogram, the patient remains disease-free.  Clinically, the patient continues to do very well.  I will see her back in 1 year for a repeat clinical breast exam. Her annual mammogram will be scheduled before her next visit for her continued radiographic breast cancer surveillance.  The patient understands all the plans discussed today and is in agreement with them.    Felishia Wartman DELENA Kerns, MD

## 2024-03-22 ENCOUNTER — Inpatient Hospital Stay: Payer: Medicaid Other | Admitting: Oncology

## 2024-03-22 ENCOUNTER — Inpatient Hospital Stay: Attending: Oncology | Admitting: Oncology

## 2024-03-22 ENCOUNTER — Telehealth: Payer: Self-pay | Admitting: Oncology

## 2024-03-22 VITALS — BP 144/83 | HR 85 | Temp 98.2°F | Resp 16 | Wt 189.7 lb

## 2024-03-22 DIAGNOSIS — Z853 Personal history of malignant neoplasm of breast: Secondary | ICD-10-CM | POA: Insufficient documentation

## 2024-03-22 DIAGNOSIS — C50111 Malignant neoplasm of central portion of right female breast: Secondary | ICD-10-CM

## 2024-03-22 DIAGNOSIS — Z79899 Other long term (current) drug therapy: Secondary | ICD-10-CM | POA: Insufficient documentation

## 2024-03-22 DIAGNOSIS — Z170 Estrogen receptor positive status [ER+]: Secondary | ICD-10-CM | POA: Diagnosis not present

## 2024-03-22 DIAGNOSIS — Z9011 Acquired absence of right breast and nipple: Secondary | ICD-10-CM | POA: Diagnosis not present

## 2024-03-22 NOTE — Telephone Encounter (Signed)
 Patient has been scheduled for follow-up visit per 03/22/24 LOS.  Pt given an appt calendar with date and time.

## 2025-03-21 ENCOUNTER — Inpatient Hospital Stay: Admitting: Oncology
